# Patient Record
Sex: Female | Born: 1945 | Race: White | Hispanic: No | Marital: Married | Smoking: Never smoker
Health system: Southern US, Community
[De-identification: ages and names within clinical notes are randomized; demographics above are authoritative.]

## PROBLEM LIST (undated history)

## (undated) DIAGNOSIS — E785 Hyperlipidemia, unspecified: Secondary | ICD-10-CM

## (undated) DIAGNOSIS — R0602 Shortness of breath: Secondary | ICD-10-CM

## (undated) DIAGNOSIS — M341 CR(E)ST syndrome: Secondary | ICD-10-CM

## (undated) DIAGNOSIS — Z789 Other specified health status: Secondary | ICD-10-CM

## (undated) DIAGNOSIS — I272 Pulmonary hypertension, unspecified: Secondary | ICD-10-CM

## (undated) DIAGNOSIS — M1990 Unspecified osteoarthritis, unspecified site: Secondary | ICD-10-CM

## (undated) DIAGNOSIS — R002 Palpitations: Secondary | ICD-10-CM

## (undated) DIAGNOSIS — I10 Essential (primary) hypertension: Secondary | ICD-10-CM

## (undated) DIAGNOSIS — K219 Gastro-esophageal reflux disease without esophagitis: Secondary | ICD-10-CM

## (undated) DIAGNOSIS — E041 Nontoxic single thyroid nodule: Secondary | ICD-10-CM

## (undated) DIAGNOSIS — I509 Heart failure, unspecified: Secondary | ICD-10-CM

## (undated) HISTORY — DX: Cr(e)st syndrome: M34.1

## (undated) HISTORY — DX: Shortness of breath: R06.02

## (undated) HISTORY — DX: Unspecified osteoarthritis, unspecified site: M19.90

## (undated) HISTORY — DX: Palpitations: R00.2

## (undated) HISTORY — DX: Nontoxic single thyroid nodule: E04.1

## (undated) HISTORY — DX: Essential (primary) hypertension: I10

## (undated) HISTORY — DX: Other specified health status: Z78.9

## (undated) HISTORY — PX: LAPAROSCOPY: SHX197

## (undated) HISTORY — DX: Hyperlipidemia, unspecified: E78.5

## (undated) HISTORY — DX: Pulmonary hypertension, unspecified: I27.20

## (undated) HISTORY — DX: Gastro-esophageal reflux disease without esophagitis: K21.9

---

## 1982-06-05 HISTORY — PX: OTHER SURGICAL HISTORY: SHX169

## 2011-01-25 ENCOUNTER — Telehealth: Payer: Self-pay | Admitting: Cardiovascular Disease

## 2011-01-25 NOTE — Telephone Encounter (Signed)
Consult referral from [facility] spoke w/ Melina Schools, referring pt for symptoms of SOB, palpitations, hx of exertion, hx of hypertension, faxing over records, please call pt back to get in sooner for consult with Dr. Elease Hashimoto @ [phone].

## 2011-02-08 ENCOUNTER — Encounter: Payer: Self-pay | Admitting: Cardiovascular Disease

## 2011-02-08 ENCOUNTER — Ambulatory Visit (INDEPENDENT_AMBULATORY_CARE_PROVIDER_SITE_OTHER): Payer: BC Managed Care – PPO | Admitting: Cardiovascular Disease

## 2011-02-08 DIAGNOSIS — R0600 Dyspnea, unspecified: Secondary | ICD-10-CM

## 2011-02-08 DIAGNOSIS — E785 Hyperlipidemia, unspecified: Secondary | ICD-10-CM

## 2011-02-08 DIAGNOSIS — R0609 Other forms of dyspnea: Secondary | ICD-10-CM

## 2011-02-08 NOTE — Assessment & Plan Note (Signed)
I suspect that most of her shortness of breath is just do to deconditioning. Recently can do an echocardiogram for further evaluation of her dyspnea and her valvular function although her cardiac exam is normal here today. I doubt that she has significant valvular disease or congestive heart failure.  He does not describe any symptoms of chest pain or chest tightness and so don't think that we need to evaluate her for ischemic heart disease at this point.  She would like to try walking on irregular basis to see if she is able to start a good exercise regimen. If she does well with that, then I think it we will not have to do anything further. On the other hand, we'll be very quick to refer her on for echocardiogram or an nuclear stress testing if she has problems with her exercise regimen to

## 2011-02-08 NOTE — Progress Notes (Addendum)
Olivia Washington Date of Birth  Apr 06, 1946 [facility] [address]     [address] [place], [place]  [address] [phone]  Fax  [phone]  History of Present Illness:  65 year old female with a history of shortness of breath and palpitations. She has a history of hyperlipidemia. She does not tolerate any statin medications. She has a history of CREST syndrome.  Several weeks ago she had several episodes of shortness of breath. One episode occurred after she picked up a laundry basket. Another episode occurred while she was at school walking up an incline. Both of these were associated with some slight palpitations.  She had an x-ray and was told that she had COPD. She was referred here for further evaluation.  She works at the [employer] and at school. She's been getting a little bit more exercise for the past several weeks and she's feeling better. She's not having quite as much shortness of breath.  He denies any chest pain or chest heaviness throughout all this.  Current Outpatient Prescriptions  Medication Sig Dispense Refill  . amitriptyline (ELAVIL) 10 MG tablet Take 10 mg by mouth at bedtime.        Marland Kitchen aspirin 81 MG tablet Take 81 mg by mouth daily.        . bisoprolol-hydrochlorothiazide (ZIAC) 5-6.25 MG per tablet Take 1 tablet by mouth daily.        . lansoprazole (PREVACID) 30 MG capsule Take 30 mg by mouth 2 (two) times daily.        Marland Kitchen loratadine (CLARITIN) 10 MG tablet Take 10 mg by mouth daily.        . meclizine (ANTIVERT) 25 MG tablet Take 25 mg by mouth 3 (three) times daily as needed.        . naproxen (NAPROSYN) 500 MG tablet Take 500 mg by mouth as needed.       . RED YEAST RICE EXTRACT PO Take by mouth 4 (four) times daily.        . SUMAtriptan (IMITREX) 50 MG tablet Take 50 mg by mouth every 2 (two) hours as needed.        . vitamin E (VITAMIN E) 400 UNIT capsule Take 400 Units by mouth daily.           Allergies    Allergen Reactions  . Cortisporin   . Keflex   . Macrodantin   . Neomycin   . Parafon Forte Dsc   . Penicillins   . Septra (Bactrim)   . Sulfa Drugs Cross Reactors   . Zithromax (Azithromycin Dihydrate)     Past Medical History  Diagnosis Date  . Hypertension   . Migraine   . Hyperlipidemia   . GERD (gastroesophageal reflux disease)   . Thyroid nodule   . CREST (calcinosis, Raynaud's phenomenon, esophageal dysfunction, sclerodactyly, telangiectasia)   . CREST syndrome   . Allergy history unknown   . SOB (shortness of breath)   . Heart palpitations   . Osteoarthrosis     unspecified whether generalized or localized    Past Surgical History  Procedure Date  . Laparoscopy     History  Smoking status  . Never Smoker   Smokeless tobacco  . Not on file    History  Alcohol Use No    Family History  Problem Relation Age of Onset  . Stroke Mother   . Hypertension Mother   . Hyperlipidemia Mother   . Diabetes Father   .  Heart failure Father   . Hyperlipidemia Brother   . Hypertension Brother     Reviw of Systems:  Reviewed in the HPI.  All other systems are negative.  Physical Exam: BP 142/88  Pulse 62  Ht 5\' 5"  (1.651 m)  Wt 187 lb 12.8 oz (85.186 kg)  BMI 31.25 kg/m2 The patient is alert and oriented x 3.  The mood and affect are normal.   Skin: warm and dry.  Color is normal.    HEENT:   the sclera are nonicteric.  The mucous membranes are moist.  The carotids are 2+ without bruits.  There is no thyromegaly.  There is no JVD.    Lungs: clear.  The chest wall is non tender.    Heart: regular rate with a normal S1 and S2.  There are no murmurs, gallops, or rubs. The PMI is not displaced.     Abdomen: good bowel sounds.  There is no guarding or rebound.  There is no hepatosplenomegaly or tenderness.  There are no masses.   Extremities:  no clubbing, cyanosis, or edema.  The legs are without rashes.  The distal pulses are intact.   Neuro:  Cranial  nerves II - XII are intact.  Motor and sensory functions are intact.    The gait is normal.  ECG: Normal sinus rhythm. She is no ST or T wave changes.  Assessment / Plan:

## 2011-02-09 ENCOUNTER — Encounter: Payer: Self-pay | Admitting: Cardiovascular Disease

## 2011-05-16 ENCOUNTER — Encounter: Payer: Self-pay | Admitting: Cardiovascular Disease

## 2011-05-16 ENCOUNTER — Ambulatory Visit (INDEPENDENT_AMBULATORY_CARE_PROVIDER_SITE_OTHER): Payer: BC Managed Care – PPO | Admitting: Cardiovascular Disease

## 2011-05-16 VITALS — BP 118/72 | HR 64 | Ht 65.0 in | Wt 186.0 lb

## 2011-05-16 DIAGNOSIS — R0609 Other forms of dyspnea: Secondary | ICD-10-CM

## 2011-05-16 DIAGNOSIS — R0600 Dyspnea, unspecified: Secondary | ICD-10-CM

## 2011-05-16 NOTE — Assessment & Plan Note (Signed)
Her dyspnea is well controlled. At this point I do not think that a need to follow her on a regular basis but I would be happy to see her in the future if needed. She is to continue with a regular exercise program.

## 2011-05-16 NOTE — Progress Notes (Signed)
Olivia Washington Date of Birth  1945/06/17 [facility] [address]    [address] [place], [place]  [address] [phone]  Fax  [phone]  History of Present Illness:  Ounces a 65 year old female who I saw several months ago for some shortness breath. She's been on an exercise program. Her dyspnea with exertion has gradually improved. She's not having episodes of chest pain or  worsening shortness of breath.  Current Outpatient Prescriptions on File Prior to Visit  Medication Sig Dispense Refill  . amitriptyline (ELAVIL) 10 MG tablet Take 10 mg by mouth at bedtime.        Marland Kitchen aspirin 81 MG tablet Take 81 mg by mouth daily.        . bisoprolol-hydrochlorothiazide (ZIAC) 5-6.25 MG per tablet Take 1 tablet by mouth daily.        . lansoprazole (PREVACID) 30 MG capsule Take 30 mg by mouth 2 (two) times daily.        Marland Kitchen loratadine (CLARITIN) 10 MG tablet Take 10 mg by mouth daily.        . meclizine (ANTIVERT) 25 MG tablet Take 25 mg by mouth 3 (three) times daily as needed.        . naproxen (NAPROSYN) 500 MG tablet Take 500 mg by mouth as needed.       . RED YEAST RICE EXTRACT PO Take by mouth 4 (four) times daily.        . SUMAtriptan (IMITREX) 50 MG tablet Take 50 mg by mouth every 2 (two) hours as needed.        . vitamin E (VITAMIN E) 400 UNIT capsule Take 400 Units by mouth daily.          Allergies  Allergen Reactions  . Atorvastatin Other (See Comments)    Muscle aches  . Cortisporin   . Keflex   . Macrodantin   . Neomycin   . Parafon Forte Dsc   . Penicillins   . Pravastatin Other (See Comments)    Muscle aches   . Septra (Bactrim)   . Simvastatin Other (See Comments)    Muscle aches   . Sulfa Drugs Cross Reactors   . Zithromax (Azithromycin Dihydrate)     Past Medical History  Diagnosis Date  . Hypertension   . Migraine   . Hyperlipidemia   . GERD (gastroesophageal reflux disease)   . Thyroid nodule   . CREST (calcinosis, Raynaud's phenomenon,  esophageal dysfunction, sclerodactyly, telangiectasia)   . CREST syndrome   . Allergy history unknown   . SOB (shortness of breath)   . Heart palpitations   . Osteoarthrosis     unspecified whether generalized or localized    Past Surgical History  Procedure Date  . Laparoscopy     History  Smoking status  . Never Smoker   Smokeless tobacco  . Not on file    History  Alcohol Use No    Family History  Problem Relation Age of Onset  . Stroke Mother   . Hypertension Mother   . Hyperlipidemia Mother   . Diabetes Father   . Heart failure Father   . Hyperlipidemia Brother   . Hypertension Brother     Reviw of Systems:  Reviewed in the HPI.  All other systems are negative.  Physical Exam: BP 118/72  Pulse 64  Ht 5\' 5"  (1.651 m)  Wt 186 lb (84.369 kg)  BMI 30.95 kg/m2 The patient is alert and oriented x 3.  The mood and affect are normal.   Skin: warm and dry.  Color is normal.    HEENT:   Normocephalic/atraumatic. Her carotids are normal. There is no JVD. Her mucous membranes are moist. Neck is supple.  Lungs: Lungs are clear.   Heart: Regular rate, S1-S2.    Abdomen: Abdominal tenderness good bowel sounds and is nontender. There is no hepatosplenomegaly.  Extremities:  No clubbing cyanosis or edema  Neuro:  Exam is nonfocal  ECG:  Assessment / Plan:

## 2011-05-16 NOTE — Patient Instructions (Signed)
Your physician recommends that you schedule a follow-up appointment in: AS NEEDED BASIS  

## 2012-05-07 ENCOUNTER — Encounter: Payer: Self-pay | Admitting: Nurse Practitioner

## 2012-05-07 ENCOUNTER — Ambulatory Visit (INDEPENDENT_AMBULATORY_CARE_PROVIDER_SITE_OTHER): Payer: BC Managed Care – PPO | Admitting: Nurse Practitioner

## 2012-05-07 VITALS — BP 140/88 | HR 68 | Ht 65.0 in | Wt 182.1 lb

## 2012-05-07 DIAGNOSIS — M349 Systemic sclerosis, unspecified: Secondary | ICD-10-CM

## 2012-05-07 DIAGNOSIS — R0600 Dyspnea, unspecified: Secondary | ICD-10-CM

## 2012-05-07 DIAGNOSIS — R0609 Other forms of dyspnea: Secondary | ICD-10-CM

## 2012-05-07 DIAGNOSIS — M341 CR(E)ST syndrome: Secondary | ICD-10-CM

## 2012-05-07 DIAGNOSIS — R9431 Abnormal electrocardiogram [ECG] [EKG]: Secondary | ICD-10-CM

## 2012-05-07 LAB — CBC WITH DIFFERENTIAL/PLATELET
Basophils Absolute: 0.1 10*3/uL (ref 0.0–0.1)
Basophils Relative: 0.7 % (ref 0.0–3.0)
Eosinophils Absolute: 0.1 10*3/uL (ref 0.0–0.7)
Eosinophils Relative: 0.8 % (ref 0.0–5.0)
HCT: 43.1 % (ref 36.0–46.0)
Hemoglobin: 13.8 g/dL (ref 12.0–15.0)
Lymphocytes Relative: 25.3 % (ref 12.0–46.0)
Lymphs Abs: 1.8 10*3/uL (ref 0.7–4.0)
MCHC: 32.1 g/dL (ref 30.0–36.0)
MCV: 81.4 fl (ref 78.0–100.0)
Monocytes Absolute: 0.8 10*3/uL (ref 0.1–1.0)
Monocytes Relative: 11.4 % (ref 3.0–12.0)
Neutro Abs: 4.5 10*3/uL (ref 1.4–7.7)
Neutrophils Relative %: 61.8 % (ref 43.0–77.0)
Platelets: 242 10*3/uL (ref 150.0–400.0)
RBC: 5.3 Mil/uL — ABNORMAL HIGH (ref 3.87–5.11)
RDW: 14.4 % (ref 11.5–14.6)
WBC: 7.3 10*3/uL (ref 4.5–10.5)

## 2012-05-07 LAB — BASIC METABOLIC PANEL
BUN: 14 mg/dL (ref 6–23)
CO2: 28 mEq/L (ref 19–32)
Calcium: 9.7 mg/dL (ref 8.4–10.5)
Chloride: 100 mEq/L (ref 96–112)
Creatinine, Ser: 1 mg/dL (ref 0.4–1.2)
GFR: 62.89 mL/min (ref >60.00)
Glucose, Bld: 90 mg/dL (ref 70–99)
Potassium: 3.8 mEq/L (ref 3.5–5.1)
Sodium: 136 mEq/L (ref 135–145)

## 2012-05-07 LAB — BRAIN NATRIURETIC PEPTIDE: Pro B Natriuretic peptide (BNP): 277 pg/mL — ABNORMAL HIGH (ref 0.0–100.0)

## 2012-05-07 NOTE — Progress Notes (Signed)
Olivia Washington Date of Birth: July 23, 1945 Medical Record #[id number]  History of Present Illness: Olivia Washington is seen back today for a work in visit. She is seen for Dr. Elease Hashimoto. She has HTN, CREST, HLD and OA. She has had chronic issues with shortness of breath.   She was last here about a year ago. Was felt to be doing well. Her dyspnea had improved with exercise.   She comes in today. She is here alone. She is here because of a spell that she had last Saturday. She was carrying the laundry basket. Got short of breath and felt lightheaded. No pain at that time. Went to lie down and then felt like "an elephant" was on her chest. She immediately sat up and it went away. It has not recurred. She does feel like she has gotten more short of breath over the past few months that is worse with exertion. She is not exercising but stays active with her job. Does not know how her blood pressure has been. No cuff at home. Does probably get too much salt. Saw her primary care about 6 months ago with an EKG. She says she was told the machine was malfunctioning. No recent labs reported.   Current Outpatient Prescriptions on File Prior to Visit  Medication Sig Dispense Refill  . amitriptyline (ELAVIL) 10 MG tablet Take 10 mg by mouth at bedtime.        Marland Kitchen aspirin 81 MG tablet Take 81 mg by mouth daily.        . bisoprolol-hydrochlorothiazide (ZIAC) 5-6.25 MG per tablet Take 1 tablet by mouth daily.        . Calcium-Vitamin D-Vitamin K 500-500-40 MG-UNT-MCG CHEW Chew by mouth 2 (two) times daily.      . cyclobenzaprine (FLEXERIL) 10 MG tablet Take 10 mg by mouth as needed.       Marland Kitchen HYDROcodone-acetaminophen (VICODIN) 5-500 MG per tablet Take 1 tablet by mouth as needed. migraines      . lansoprazole (PREVACID) 30 MG capsule Take 30 mg by mouth 2 (two) times daily.        Marland Kitchen loratadine (CLARITIN) 10 MG tablet Take 10 mg by mouth daily.        . meclizine (ANTIVERT) 25 MG tablet Take 25 mg by mouth 3 (three) times  daily as needed.        . RED YEAST RICE EXTRACT PO Take 600 mg by mouth 2 (two) times daily.       . SUMAtriptan (IMITREX) 50 MG tablet Take 50 mg by mouth as needed.       . vitamin E (VITAMIN E) 400 UNIT capsule Take 400 Units by mouth daily.        Marland Kitchen ibuprofen (ADVIL,MOTRIN) 800 MG tablet Take 800 mg by mouth as needed.       . naproxen (NAPROSYN) 500 MG tablet Take 500 mg by mouth as needed.         Allergies  Allergen Reactions  . Atorvastatin Other (See Comments)    Muscle aches  . Cephalexin   . Chlorzoxazone   . Macrodantin   . Neomycin   . Neomycin-Polymyxin-Hc   . Penicillins   . Pravastatin Other (See Comments)    Muscle aches   . Septra (Bactrim)   . Simvastatin Other (See Comments)    Muscle aches   . Sulfa Drugs Cross Reactors   . Zithromax (Azithromycin Dihydrate)     Past Medical History  Diagnosis Date  .  Hypertension   . Migraine   . Hyperlipidemia   . GERD (gastroesophageal reflux disease)   . Thyroid nodule   . CREST (calcinosis, Raynaud's phenomenon, esophageal dysfunction, sclerodactyly, telangiectasia)   . CREST syndrome   . Allergy history unknown   . SOB (shortness of breath)   . Heart palpitations   . Osteoarthrosis     unspecified whether generalized or localized    Past Surgical History  Procedure Date  . Laparoscopy     History  Smoking status  . Never Smoker   Smokeless tobacco  . Not on file    History  Alcohol Use No    Family History  Problem Relation Age of Onset  . Stroke Mother   . Hypertension Mother   . Hyperlipidemia Mother   . Diabetes Father   . Heart failure Father   . Hyperlipidemia Brother   . Hypertension Brother     Review of Systems: The review of systems is per the HPI.  All other systems were reviewed and are negative.  Physical Exam: BP 140/88  Pulse 68  Ht 5\' 5"  (1.651 m)  Wt 182 lb 1.9 oz (82.609 kg)  BMI 30.31 kg/m2 Repeat BP by me is 150/90. Patient is very pleasant and in no acute  distress. Skin is warm and dry. Color is normal.  HEENT is unremarkable. Normocephalic/atraumatic. PERRL. Sclera are nonicteric. Neck is supple. No masses. No JVD. Lungs are clear. Cardiac exam shows a regular rate and rhythm. Abdomen is soft. Extremities are without edema. Gait and ROM are intact. No gross neurologic deficits noted.  LABORATORY DATA: Pending.  No results found for this basename: WBC, HGB, HCT, PLT, GLUCOSE, CHOL, TRIG, HDL, LDLDIRECT, LDLCALC, ALT, AST, NA, K, CL, CREATININE, BUN, CO2, TSH, PSA, INR, GLUF, HGBA1C, MICROALBUR    Assessment / Plan:  1. Dyspnea - will need to check an echo and also check labs today.   2. One episode of chest discomfort with lying down - improved with immediately sitting up. No recurrence. We checked an EKG here today. She has diffuse ST and T wave changes that are new compared to her last tracing from 2012. This was reviewed with Dr. Elease Hashimoto today. We will go ahead and arrange for Lexiscan. She does not feel like she could walk sufficiently on the treadmill.   3. HLD - not on statin due to past intolerance. Takes red yeast rice.   4. HTN - BP probably not at goal. She is willing to start checking at home and keep a diary.   I will see her back in about 2 weeks after her studies are complete. Further disposition to follow.   Patient is agreeable to this plan and will call if any problems develop in the interim.

## 2012-05-07 NOTE — Patient Instructions (Addendum)
Stay on your current medicines for now  We will check labs today  We are going to arrange for an ultrasound of your heart  Get a blood pressure cuff and monitor your blood pressure at home. Keep a diary and bring in to your next visit  Walking every day is encouraged  Try to cut back your salt use  I will see you back on a day that Dr. Elease Hashimoto is here in about 2 weeks.   Call the [facility] office at [phone] if you have any questions, problems or concerns.

## 2012-05-09 ENCOUNTER — Ambulatory Visit (HOSPITAL_COMMUNITY): Payer: BC Managed Care – PPO | Attending: Cardiology | Admitting: Radiology

## 2012-05-09 VITALS — BP 143/84 | HR 66 | Ht 65.0 in | Wt 182.0 lb

## 2012-05-09 DIAGNOSIS — R0989 Other specified symptoms and signs involving the circulatory and respiratory systems: Secondary | ICD-10-CM | POA: Insufficient documentation

## 2012-05-09 DIAGNOSIS — R079 Chest pain, unspecified: Secondary | ICD-10-CM

## 2012-05-09 DIAGNOSIS — R0600 Dyspnea, unspecified: Secondary | ICD-10-CM

## 2012-05-09 DIAGNOSIS — R9431 Abnormal electrocardiogram [ECG] [EKG]: Secondary | ICD-10-CM | POA: Insufficient documentation

## 2012-05-09 DIAGNOSIS — E785 Hyperlipidemia, unspecified: Secondary | ICD-10-CM | POA: Insufficient documentation

## 2012-05-09 DIAGNOSIS — M341 CR(E)ST syndrome: Secondary | ICD-10-CM

## 2012-05-09 DIAGNOSIS — R5381 Other malaise: Secondary | ICD-10-CM | POA: Insufficient documentation

## 2012-05-09 DIAGNOSIS — R0609 Other forms of dyspnea: Secondary | ICD-10-CM | POA: Insufficient documentation

## 2012-05-09 DIAGNOSIS — R42 Dizziness and giddiness: Secondary | ICD-10-CM | POA: Insufficient documentation

## 2012-05-09 DIAGNOSIS — I10 Essential (primary) hypertension: Secondary | ICD-10-CM | POA: Insufficient documentation

## 2012-05-09 DIAGNOSIS — R000 Tachycardia, unspecified: Secondary | ICD-10-CM | POA: Insufficient documentation

## 2012-05-09 DIAGNOSIS — R55 Syncope and collapse: Secondary | ICD-10-CM | POA: Insufficient documentation

## 2012-05-09 DIAGNOSIS — R0602 Shortness of breath: Secondary | ICD-10-CM | POA: Insufficient documentation

## 2012-05-09 DIAGNOSIS — R0789 Other chest pain: Secondary | ICD-10-CM | POA: Insufficient documentation

## 2012-05-09 MED ORDER — TECHNETIUM TC 99M SESTAMIBI GENERIC - CARDIOLITE
11.0000 | Freq: Once | INTRAVENOUS | Status: AC | PRN
  Administered 2012-05-09: 11 via INTRAVENOUS

## 2012-05-09 MED ORDER — TECHNETIUM TC 99M SESTAMIBI GENERIC - CARDIOLITE
33.0000 | Freq: Once | INTRAVENOUS | Status: AC | PRN
  Administered 2012-05-09: 33 via INTRAVENOUS

## 2012-05-09 MED ORDER — REGADENOSON 0.4 MG/5ML IV SOLN
0.4000 mg | Freq: Once | INTRAVENOUS | Status: AC
  Administered 2012-05-09: 0.4 mg via INTRAVENOUS

## 2012-05-09 NOTE — Progress Notes (Signed)
[facility] SITE 3 NUCLEAR MED [id number] [place] [address] [phone]  Cardiology Nuclear Med Study  Olivia Washington is a 66 y.o. female     MRN : [id number]     DOB: [birth year]  Procedure Date: 05/09/2012  Nuclear Med Background Indication for Stress Test:  Evaluation for Ischemia and Abnormal EKG History:  No previous documented CAD Cardiac Risk Factors: Hypertension and Lipids  Symptoms:  Chest Pressure with and without Exertion (last episode of chest discomfort was about 2-weeks ago), Dizziness, DOE/SOB, Fatigue with Exertion, Rapid Heart Rate, Dizziness/Light-Headedness and h/o Syncope with last episode being 2-3 months ago.   Nuclear Pre-Procedure Caffeine/Decaff Intake:  None NPO After: 9:00pm   Lungs:  Clear. O2 Sat: 95% on room air. IV 0.9% NS with Angio Cath:  22g  IV Site: R Antecubital  IV Started by:  Bonnita Levan, RN  Chest Size (in):  42 Cup Size: B  Height: 5\' 5"  (1.651 m)  Weight:  182 lb (82.555 kg)  BMI:  Body mass index is 30.29 kg/(m^2). Tech Comments:  N/A    Nuclear Med Study 1 or 2 day study: 1 day  Stress Test Type:  Treadmill/Lexiscan  Reading MD: Olga Millers, MD  Order Authorizing Provider:  Kristeen Miss, MD  Resting Radionuclide: Technetium 85m Sestamibi  Resting Radionuclide Dose: 11.0 mCi   Stress Radionuclide:  Technetium 58m Sestamibi  Stress Radionuclide Dose: 33.0 mCi           Stress Protocol Rest HR: 66 Stress HR: 99  Rest BP: 143/84 Stress BP: 142/72  Exercise Time (min): 2:00 METS: n/a   Predicted Max HR: 156 bpm % Max HR: 63.46 bpm Rate Pressure Product: 27253   Dose of Adenosine (mg):  n/a Dose of Lexiscan: 0.4 mg  Dose of Atropine (mg): n/a Dose of Dobutamine: n/a mcg/kg/min (at max HR)  Stress Test Technologist: Smiley Houseman, CMA-N  Nuclear Technologist:  Domenic Polite, CNMT     Rest Procedure:  Myocardial perfusion imaging was performed at rest 45 minutes following the  intravenous administration of Technetium 57m Sestamibi.  Rest ECG: NSR, CRO prior inferior MI, anterior TWI.  Stress Procedure:  The patient received IV Lexiscan 0.4 mg over 15-seconds with concurrent low level exercise and then Technetium 52m Sestamibi was injected at 30-seconds while the patient continued walking one more minute.  Quantitative spect images were obtained after a 45-minute delay.  Stress ECG: No significant change from baseline ECG  QPS Raw Data Images:  Acquisition technically good; normal left ventricular size. Stress Images:  There is decreased uptake in the anterior wall. Rest Images:  There is decreased uptake in the anterior wall. Subtraction (SDS):  No evidence of ischemia. Transient Ischemic Dilatation (Normal <1.22):  1.08 Lung/Heart Ratio (Normal <0.45):  0.42  Quantitative Gated Spect Images QGS EDV:  42 ml QGS ESV:  9 ml  Impression Exercise Capacity:  Lexiscan with no exercise. BP Response:  Normal blood pressure response. Clinical Symptoms:  There is dyspnea. ECG Impression:  No significant ST segment change suggestive of ischemia. Comparison with Prior Nuclear Study: No previous nuclear study performed  Overall Impression:  Low risk stress nuclear study with a small, mild, fixed distal anterior defect consistent with soft tissue attenuation; no ischemia. There appears to be RV dysfunction on gated images; suggest echo if clinically indicated; CRO right breast lump on raw images; suggest clinical correlation and mammogram.  LV Ejection Fraction: 68%.  LV Wall Motion:  NL  LV Function; NL Wall Motion  Olga Millers

## 2012-05-13 ENCOUNTER — Ambulatory Visit (HOSPITAL_COMMUNITY): Payer: BC Managed Care – PPO | Attending: Internal Medicine | Admitting: Radiology

## 2012-05-13 DIAGNOSIS — I319 Disease of pericardium, unspecified: Secondary | ICD-10-CM | POA: Insufficient documentation

## 2012-05-13 DIAGNOSIS — R0609 Other forms of dyspnea: Secondary | ICD-10-CM | POA: Insufficient documentation

## 2012-05-13 DIAGNOSIS — I369 Nonrheumatic tricuspid valve disorder, unspecified: Secondary | ICD-10-CM | POA: Insufficient documentation

## 2012-05-13 DIAGNOSIS — R0989 Other specified symptoms and signs involving the circulatory and respiratory systems: Secondary | ICD-10-CM | POA: Insufficient documentation

## 2012-05-13 DIAGNOSIS — R0600 Dyspnea, unspecified: Secondary | ICD-10-CM

## 2012-05-13 DIAGNOSIS — M341 CR(E)ST syndrome: Secondary | ICD-10-CM

## 2012-05-13 DIAGNOSIS — R002 Palpitations: Secondary | ICD-10-CM | POA: Insufficient documentation

## 2012-05-13 DIAGNOSIS — I059 Rheumatic mitral valve disease, unspecified: Secondary | ICD-10-CM | POA: Insufficient documentation

## 2012-05-13 NOTE — Progress Notes (Signed)
Echocardiogram performed.  

## 2012-05-22 ENCOUNTER — Encounter: Payer: Self-pay | Admitting: Nurse Practitioner

## 2012-05-22 ENCOUNTER — Ambulatory Visit (INDEPENDENT_AMBULATORY_CARE_PROVIDER_SITE_OTHER): Payer: BC Managed Care – PPO | Admitting: Nurse Practitioner

## 2012-05-22 ENCOUNTER — Ambulatory Visit
Admission: RE | Admit: 2012-05-22 | Discharge: 2012-05-22 | Disposition: A | Discharge status: Home / Self Care | Payer: BC Managed Care – PPO | Source: Ambulatory Visit | Attending: Nurse Practitioner | Admitting: Nurse Practitioner

## 2012-05-22 ENCOUNTER — Other Ambulatory Visit: Payer: Self-pay | Admitting: Nurse Practitioner

## 2012-05-22 VITALS — BP 130/84 | HR 80 | Ht 65.0 in | Wt 185.8 lb

## 2012-05-22 DIAGNOSIS — Z01810 Encounter for preprocedural cardiovascular examination: Secondary | ICD-10-CM

## 2012-05-22 DIAGNOSIS — R0602 Shortness of breath: Secondary | ICD-10-CM

## 2012-05-22 DIAGNOSIS — I272 Pulmonary hypertension, unspecified: Secondary | ICD-10-CM | POA: Insufficient documentation

## 2012-05-22 DIAGNOSIS — I2789 Other specified pulmonary heart diseases: Secondary | ICD-10-CM

## 2012-05-22 LAB — APTT: aPTT: 27.7 s (ref 21.7–28.8)

## 2012-05-22 LAB — BASIC METABOLIC PANEL
BUN: 21 mg/dL (ref 6–23)
CO2: 27 mEq/L (ref 19–32)
Calcium: 9.3 mg/dL (ref 8.4–10.5)
Chloride: 103 mEq/L (ref 96–112)
Creatinine, Ser: 0.9 mg/dL (ref 0.4–1.2)
GFR: 64.44 mL/min (ref >60.00)
Glucose, Bld: 95 mg/dL (ref 70–99)
Potassium: 4 mEq/L (ref 3.5–5.1)
Sodium: 137 mEq/L (ref 135–145)

## 2012-05-22 LAB — CBC WITH DIFFERENTIAL/PLATELET
Basophils Absolute: 0 10*3/uL (ref 0.0–0.1)
Basophils Relative: 0.8 % (ref 0.0–3.0)
Eosinophils Absolute: 0.1 10*3/uL (ref 0.0–0.7)
Eosinophils Relative: 0.9 % (ref 0.0–5.0)
HCT: 39.9 % (ref 36.0–46.0)
Hemoglobin: 12.9 g/dL (ref 12.0–15.0)
Lymphocytes Relative: 24.7 % (ref 12.0–46.0)
Lymphs Abs: 1.5 10*3/uL (ref 0.7–4.0)
MCHC: 32.3 g/dL (ref 30.0–36.0)
MCV: 79.6 fl (ref 78.0–100.0)
Monocytes Absolute: 0.6 10*3/uL (ref 0.1–1.0)
Monocytes Relative: 9.8 % (ref 3.0–12.0)
Neutro Abs: 4 10*3/uL (ref 1.4–7.7)
Neutrophils Relative %: 63.8 % (ref 43.0–77.0)
Platelets: 254 10*3/uL (ref 150.0–400.0)
RBC: 5.01 Mil/uL (ref 3.87–5.11)
RDW: 15.1 % — ABNORMAL HIGH (ref 11.5–14.6)
WBC: 6.2 10*3/uL (ref 4.5–10.5)

## 2012-05-22 LAB — PROTIME-INR
INR: 0.9 ratio (ref 0.8–1.0)
Prothrombin Time: 10 s — ABNORMAL LOW (ref 10.2–12.4)

## 2012-05-22 NOTE — Patient Instructions (Addendum)
We need to check labs today. When you leave here, go to [facility] and get a chest Xray  We will refer you to Dr. Delton Coombes (pulmonary) to see after the first of the New Year  We are going to refer you to Dr. Azzie Roup (rheumatology) for evaluation  See Dr. Elease Hashimoto in one month  You are scheduled for an outpatient cardiac catheterization on Friday, December 20th with Dr. Elease Hashimoto or associates.  Go the [facility] at [facility] on 8:30AM.  Call the Heart & Vascular Center at [phone] if you are unable to make your appointment.  The code to get into the parking garage under the building is 0009. Then go to the first floor.  You must have someone available to drive you home. Someone needs to be with you for the first 24 hours after you arrive home. Please wear clothes that are easy to get on and off.  Do not eat or drink after midnight on Thursday. You may have water only with your medications on the morning of your procedure.   May sure you take your aspirin on the day of your procedure.        Directions to the Outpatient Cardiac Cath Lab at [facility]:  Please Note:  Park in [place] under the building not the parking deck.  From [employer]: Turn onto [address] Left onto [address] (1st stoplight) Right at the brick entrance to the hospital ([address] circle [address]) Bear to the right and you will see a blue sign "Heart [facility] Vascular Center" Parking garage is a sharp right, to get through the gate put in the code 0009. Once you park, take the elevator to the first floor.  Please do not arrive before 6:30 am.  The building will be dark before that time.  From [employer]: Turn onto [address] Turn left into the brick entrance to the hospital (Main circle drive) [place] to the right and you will see a blue sign "[facility]" Parking garage is a sharp right, to get through the gate put in the code 0009. Once you park,  take the elevator to the first floor.  Please do not arrive before 6:30 am.  The building will be dark before that time.

## 2012-05-22 NOTE — Progress Notes (Addendum)
Lethea Killings Date of Birth: 02-28-1946 Medical Record #[id number]  History of Present Illness: Ms. Olivia Washington is seen back today for a follow up visit. She is seen for Dr. Elease Hashimoto. She has HTN, CREST, HLD and OA. She has had chronic issues with shortness of breath.   I saw her earlier this month. She was having worsening shortness of breath, chest pain, and lightheadedness. We referred her for a stress test and echo with the results as noted below.   She comes in today. She is here with her husband. She says she is feeling better. No more chest pain. Still gets short winded at times. Seems to come and go. Some days she feels ok but then some days can't walk the length of the house. BP has been good. She brings in readings from work which are all ok. Not coughing. Not dizzy or lightheaded. Not exercising. Notes that her symptoms have been gradually coming on over the past year or so. She is not able to walk up a flight of steps without getting short of breath. She is not up to date on her mammograms.   Current Outpatient Prescriptions on File Prior to Visit  Medication Sig Dispense Refill  . amitriptyline (ELAVIL) 10 MG tablet Take 10 mg by mouth at bedtime.        Marland Kitchen aspirin 81 MG tablet Take 81 mg by mouth daily.        . bisoprolol-hydrochlorothiazide (ZIAC) 5-6.25 MG per tablet Take 1 tablet by mouth daily.        . Calcium-Vitamin D-Vitamin K 500-500-40 MG-UNT-MCG CHEW Chew by mouth 2 (two) times daily.      . cyclobenzaprine (FLEXERIL) 10 MG tablet Take 10 mg by mouth as needed.       Marland Kitchen HYDROcodone-acetaminophen (VICODIN) 5-500 MG per tablet Take 1 tablet by mouth as needed. migraines      . lansoprazole (PREVACID) 30 MG capsule Take 30 mg by mouth 2 (two) times daily.        Marland Kitchen loratadine (CLARITIN) 10 MG tablet Take 10 mg by mouth daily.        . meclizine (ANTIVERT) 25 MG tablet Take 25 mg by mouth 3 (three) times daily as needed.        . RED YEAST RICE EXTRACT PO Take 600 mg by mouth 2  (two) times daily.       . SUMAtriptan (IMITREX) 50 MG tablet Take 50 mg by mouth as needed.       . vitamin E (VITAMIN E) 400 UNIT capsule Take 400 Units by mouth daily.        Marland Kitchen ibuprofen (ADVIL,MOTRIN) 800 MG tablet Take 800 mg by mouth as needed.       . naproxen (NAPROSYN) 500 MG tablet Take 500 mg by mouth as needed.         Allergies  Allergen Reactions  . Atorvastatin Other (See Comments)    Muscle aches  . Cephalexin   . Chlorzoxazone   . Macrodantin   . Neomycin   . Neomycin-Polymyxin-Hc   . Penicillins   . Pravastatin Other (See Comments)    Muscle aches   . Septra (Bactrim)   . Simvastatin Other (See Comments)    Muscle aches   . Sulfa Drugs Cross Reactors   . Tape   . Zithromax (Azithromycin Dihydrate)     Past Medical History  Diagnosis Date  . Hypertension   . Migraine   . Hyperlipidemia   .  GERD (gastroesophageal reflux disease)   . Thyroid nodule   . CREST (calcinosis, Raynaud's phenomenon, esophageal dysfunction, sclerodactyly, telangiectasia)   . CREST syndrome   . Allergy history unknown   . SOB (shortness of breath)   . Heart palpitations   . Osteoarthrosis     unspecified whether generalized or localized    Past Surgical History  Procedure Date  . Laparoscopy     History  Smoking status  . Never Smoker   Smokeless tobacco  . Not on file    History  Alcohol Use No    Family History  Problem Relation Age of Onset  . Stroke Mother   . Hypertension Mother   . Hyperlipidemia Mother   . Diabetes Father   . Heart failure Father   . Hyperlipidemia Brother   . Hypertension Brother     Review of Systems: The review of systems is per the HPI.  All other systems were reviewed and are negative.  Physical Exam: BP 130/84  Pulse 80  Ht 5\' 5"  (1.651 m)  Wt 185 lb 12.8 oz (84.278 kg)  BMI 30.92 kg/m2 Patient is very pleasant and in no acute distress. She is obese. Skin is warm and dry. Color is normal.  HEENT is unremarkable.  Normocephalic/atraumatic. PERRL. Sclera are nonicteric. Neck is supple. No masses. No JVD. Lungs are clear. Cardiac exam shows a regular rate and rhythm. Abdomen is soft. Extremities are without edema. Gait and ROM are intact. No gross neurologic deficits noted.   LABORATORY DATA: CXR and labs are pending for today.   Lab Results  Component Value Date   WBC 7.3 05/07/2012   HGB 13.8 05/07/2012   HCT 43.1 05/07/2012   PLT 242.0 05/07/2012   GLUCOSE 90 05/07/2012   NA 136 05/07/2012   K 3.8 05/07/2012   CL 100 05/07/2012   CREATININE 1.0 05/07/2012   BUN 14 05/07/2012   CO2 28 05/07/2012   Echo Study Conclusions  - Left ventricle: The cavity size was normal. Wall thickness was increased in a pattern of mild LVH. Systolic function was normal. The estimated ejection fraction was in the range of 55% to 60%. Regional wall motion abnormalities cannot be excluded. Doppler parameters are consistent with abnormal left ventricular relaxation (grade 1 diastolic dysfunction). - Ventricular septum: The contour showed diastolic flattening and systolic flattening. - Left atrium: The atrium was mildly dilated. - Right ventricle: The cavity size was mildly dilated. Systolic function was moderately reduced. - Right atrium: The atrium was mildly dilated. - Atrial septum: The septum bowed from right to left, consistent with increased right atrial pressure. - Pulmonary arteries: Systolic pressure was moderately to severely increased. PA peak pressure: 54mm Hg (S). - Pericardium, extracardiac: A small pericardial effusion was identified.  Dr. Elease Hashimoto has personally reviewed this echo and felt that the echo was normal from a cardiac standpoint but with pulmonary HTN noted.   Myoview Overall Impression:   Low risk stress nuclear study with a small, mild, fixed distal anterior defect consistent with soft tissue attenuation; no ischemia. There appears to be RV dysfunction on gated images; suggest echo if  clinically indicated; CRO right breast lump on raw images; suggest clinical correlation and mammogram.  LV Ejection Fraction: 68%. LV Wall Motion: NL LV Function; NL Wall Motion   Olga Millers     Assessment / Plan: 1. Dyspnea - probably related to her CREST and now with pulmonary HTN noted on echo. She has not seen rheumatology in many  years. Will refer to Dr. Dareen Piano. Will arrange pulmonary referral with Dr. Delton Coombes as well after the first of the New Year. Will proceed with left and right heart cath with adenosine challenge this Friday to further define. The procedure, risks and benefits have been reviewed with her and her husband by Dr. Elease Hashimoto and she is willing to proceed. Will also need CT angio of the chest next Friday to rule out chronic PE. She may need to have her medicine changed from beta blocker to CCB therapy. Seeing Dr. Elease Hashimoto back in one month.   2. Atypical chest pain - probably related to the pulmonary HTN as well. This has improved.   3. HTN - blood pressure looks good.   Patient was also advised to get her mammogram updated as well.   Call the [facility] office at [phone] if you have any questions, problems or concerns.

## 2012-05-24 ENCOUNTER — Encounter (HOSPITAL_BASED_OUTPATIENT_CLINIC_OR_DEPARTMENT_OTHER)
Admission: RE | Disposition: A | Discharge status: Home / Self Care | Payer: Self-pay | Source: Ambulatory Visit | Attending: Cardiovascular Disease

## 2012-05-24 ENCOUNTER — Inpatient Hospital Stay (HOSPITAL_BASED_OUTPATIENT_CLINIC_OR_DEPARTMENT_OTHER)
Admission: RE | Admit: 2012-05-24 | Discharge: 2012-05-24 | Disposition: A | Discharge status: Home / Self Care | Payer: BC Managed Care – PPO | Source: Ambulatory Visit | Attending: Cardiovascular Disease | Admitting: Cardiovascular Disease

## 2012-05-24 DIAGNOSIS — R0989 Other specified symptoms and signs involving the circulatory and respiratory systems: Secondary | ICD-10-CM | POA: Insufficient documentation

## 2012-05-24 DIAGNOSIS — I2789 Other specified pulmonary heart diseases: Secondary | ICD-10-CM | POA: Insufficient documentation

## 2012-05-24 DIAGNOSIS — I279 Pulmonary heart disease, unspecified: Secondary | ICD-10-CM

## 2012-05-24 DIAGNOSIS — R079 Chest pain, unspecified: Secondary | ICD-10-CM

## 2012-05-24 DIAGNOSIS — Z01810 Encounter for preprocedural cardiovascular examination: Secondary | ICD-10-CM

## 2012-05-24 DIAGNOSIS — R0609 Other forms of dyspnea: Secondary | ICD-10-CM | POA: Insufficient documentation

## 2012-05-24 LAB — POCT I-STAT 3, ART BLOOD GAS (G3+)
Acid-base deficit: 1 mmol/L (ref 0.0–2.0)
O2 Saturation: 95 %
pCO2 arterial: 34.7 mmHg — ABNORMAL LOW (ref 35.0–45.0)

## 2012-05-24 LAB — POCT I-STAT 3, VENOUS BLOOD GAS (G3P V)
pCO2, Ven: 38.1 mmHg — ABNORMAL LOW (ref 45.0–50.0)
pH, Ven: 7.418 — ABNORMAL HIGH (ref 7.250–7.300)

## 2012-05-24 SURGERY — JV LEFT AND RIGHT HEART CATHETERIZATION WITH CORONARY ANGIOGRAM
Anesthesia: Moderate Sedation

## 2012-05-24 MED ORDER — DIAZEPAM 5 MG PO TABS
10.0000 mg | ORAL_TABLET | ORAL | Status: AC
  Administered 2012-05-24: 10 mg via ORAL

## 2012-05-24 MED ORDER — ADENOSINE (PAH - CATH LAB) SYRINGE(S)
84.0000 mL | Freq: Once | INTRAVENOUS | Status: AC
  Administered 2012-05-24: 84 mL via INTRAVENOUS
  Filled 2012-05-24 (×2): qty 84

## 2012-05-24 MED ORDER — SODIUM CHLORIDE 0.9 % IV SOLN: 250.0000 mL | INTRAVENOUS | Status: DC | PRN

## 2012-05-24 MED ORDER — SODIUM CHLORIDE 0.9 % IJ SOLN: 3.0000 mL | INTRAMUSCULAR | Status: DC | PRN

## 2012-05-24 MED ORDER — ACETAMINOPHEN 325 MG PO TABS: 650.0000 mg | ORAL_TABLET | ORAL | Status: DC | PRN

## 2012-05-24 MED ORDER — SODIUM CHLORIDE 0.9 % IV SOLN
INTRAVENOUS | Status: DC
  Administered 2012-05-24: 09:00:00 via INTRAVENOUS

## 2012-05-24 MED ORDER — SODIUM CHLORIDE 0.9 % IJ SOLN: 3.0000 mL | Freq: Two times a day (BID) | INTRAMUSCULAR | Status: DC

## 2012-05-24 MED ORDER — SODIUM CHLORIDE 0.9 % IV SOLN: INTRAVENOUS | Status: AC

## 2012-05-24 MED ORDER — ONDANSETRON HCL 4 MG/2ML IJ SOLN: 4.0000 mg | Freq: Four times a day (QID) | INTRAMUSCULAR | Status: DC | PRN

## 2012-05-24 NOTE — OR Nursing (Signed)
Tegaderm dressing applied, site level 0, bedrest begins at 1235 

## 2012-05-24 NOTE — CV Procedure (Signed)
    Cardiac Cath Note  Olivia Washington [id number] 02/11/1946  Procedure: Right and Left  Heart Cardiac Catheterization with Adenosine challange Indications: Chest pain, dyspnea, pulmonary hypertension  Procedure Details Consent: Obtained Time Out: Verified patient identification, verified procedure, site/side was marked, verified correct patient position, special equipment/implants available, Radiology Safety Procedures followed,  medications/allergies/relevent history reviewed, required imaging and test results available.  Performed   Medications: Fentanyl: 25 mcg Versed: 2 mg IV Adenosine 234 mg IV over Adenosine challenge period  The right femoral artery and right femoral vein were easily canulated using a modified Seldinger technique.  Hemodynamics:    Baseline RA: 11/8/6 RV: 86/7 PCWP: 8/8/5 PA:  78/27 (47)  Cardiac Output   Thermodilution: 3.6 with index of 1.9  Fick : 5.9 with index of 3.1  11.6 woods units   Adenosine Challenge We had significant difficulty with the adenosine pump and maintaining the proper adenosine dose.    Maximal Adenosine Response (at 100 mcg/kg/min) The lowest PA pressure was measured at the middle - low dose of adenosine challenge.  As we increased the adenosine dose, she started feeling chest pressure and became anxious.  Her PA pressures did not decrease significantly after that.  PA: 61/18 (34) We did not measure cardiac output in the middle doses of Adenosine challenge.   Assuming an average cardiac output of 4.0 .  The lowest Woods units observed would be estimated at 7.25.  At final adenosine dose 76/28 (46) CO (thermo) = 4.3 with index of 2.3  9.5 Woods units  Arterial Sat: 95% PA Sat: 72%.  LV pressure: 136/12 Aortic pressure: 137/69  Angiography   Left Main: smooth and normal   Left anterior Descending: smooth and normal  Left Circumflex: smooth and normal  Right Coronary Artery: large, dom. And normal  LV  Gram: normal LV function.  EF 65%  Complications: No apparent complications Patient did tolerate procedure well.  Contrast used: 60 cc  Conclusions:  Marked Pulmonary Hypertension.  There is a reversible component to her pulmonary hypertension. 2. Normal coronaries 3. Normal LV function.  Will change the bisoprolol to diltiazem. Will see her in the office in several weeks and make other medication adjustments.   Vesta Mixer, Montez Hageman., MD, [facility] 05/24/2012, 12:21 PM Office - [phone] Pager [phone]

## 2012-05-24 NOTE — H&P (Signed)
History of Present Illness:  Ms. Hanratty is now admitted for R and L heart cath and adenosine challenge.   She has HTN, CREST, HLD and OA. She has had chronic issues with shortness of breath.   She was having worsening shortness of breath, chest pain, and lightheadedness. We referred her for a stress test and echo with the results as noted below.   She says she is feeling better. No more chest pain. Still gets short winded at times. Seems to come and go. Some days she feels ok but then some days can't walk the length of the house. BP has been good. She brings in readings from work which are all ok. Not coughing. Not dizzy or lightheaded. Not exercising. Notes that her symptoms have been gradually coming on over the past year or so. She is not able to walk up a flight of steps without getting short of breath. She is not up to date on her mammograms.  Current Outpatient Prescriptions on File Prior to Visit   Medication  Sig  Dispense  Refill   .  amitriptyline (ELAVIL) 10 MG tablet  Take 10 mg by mouth at bedtime.     Marland Kitchen  aspirin 81 MG tablet  Take 81 mg by mouth daily.     .  bisoprolol-hydrochlorothiazide (ZIAC) 5-6.25 MG per tablet  Take 1 tablet by mouth daily.     .  Calcium-Vitamin D-Vitamin K 500-500-40 MG-UNT-MCG CHEW  Chew by mouth 2 (two) times daily.     .  cyclobenzaprine (FLEXERIL) 10 MG tablet  Take 10 mg by mouth as needed.     Marland Kitchen  HYDROcodone-acetaminophen (VICODIN) 5-500 MG per tablet  Take 1 tablet by mouth as needed. migraines     .  lansoprazole (PREVACID) 30 MG capsule  Take 30 mg by mouth 2 (two) times daily.     Marland Kitchen  loratadine (CLARITIN) 10 MG tablet  Take 10 mg by mouth daily.     .  meclizine (ANTIVERT) 25 MG tablet  Take 25 mg by mouth 3 (three) times daily as needed.     .  RED YEAST RICE EXTRACT PO  Take 600 mg by mouth 2 (two) times daily.     .  SUMAtriptan (IMITREX) 50 MG tablet  Take 50 mg by mouth as needed.     .  vitamin E (VITAMIN E) 400 UNIT capsule  Take 400 Units  by mouth daily.     Marland Kitchen  ibuprofen (ADVIL,MOTRIN) 800 MG tablet  Take 800 mg by mouth as needed.     .  naproxen (NAPROSYN) 500 MG tablet  Take 500 mg by mouth as needed.      Allergies   Allergen  Reactions   .  Atorvastatin  Other (See Comments)     Muscle aches   .  Cephalexin    .  Chlorzoxazone    .  Macrodantin    .  Neomycin    .  Neomycin-Polymyxin-Hc    .  Penicillins    .  Pravastatin  Other (See Comments)     Muscle aches   .  Septra (Bactrim)    .  Simvastatin  Other (See Comments)     Muscle aches   .  Sulfa Drugs Cross Reactors    .  Tape    .  Zithromax (Azithromycin Dihydrate)     Past Medical History   Diagnosis  Date   .  Hypertension    .  Migraine    .  Hyperlipidemia    .  GERD (gastroesophageal reflux disease)    .  Thyroid nodule    .  CREST (calcinosis, Raynaud's phenomenon, esophageal dysfunction, sclerodactyly, telangiectasia)    .  CREST syndrome    .  Allergy history unknown    .  SOB (shortness of breath)    .  Heart palpitations    .  Osteoarthrosis      unspecified whether generalized or localized    Past Surgical History   Procedure  Date   .  Laparoscopy     History   Smoking status   .  Never Smoker   Smokeless tobacco   .  Not on file    History   Alcohol Use  No    Family History   Problem  Relation  Age of Onset   .  Stroke  Mother    .  Hypertension  Mother    .  Hyperlipidemia  Mother    .  Diabetes  Father    .  Heart failure  Father    .  Hyperlipidemia  Brother    .  Hypertension  Brother     Review of Systems:  The review of systems is per the HPI. All other systems were reviewed and are negative.  Physical Exam:  BP 130/84  Pulse 80  Ht 5\' 5"  (1.651 m)  Wt 185 lb 12.8 oz (84.278 kg)  BMI 30.92 kg/m2  Patient is very pleasant and in no acute distress. She is obese. Skin is warm and dry. Color is normal. HEENT is unremarkable. Normocephalic/atraumatic. PERRL. Sclera are nonicteric. Neck is supple. No masses.  No JVD. Lungs are clear. Cardiac exam shows a regular rate and rhythm. Abdomen is soft. Extremities are without edema. Gait and ROM are intact. No gross neurologic deficits noted.  LABORATORY DATA: CXR and labs are pending for today.  Lab Results   Component  Value  Date    WBC  7.3  05/07/2012    HGB  13.8  05/07/2012    HCT  43.1  05/07/2012    PLT  242.0  05/07/2012    GLUCOSE  90  05/07/2012    NA  136  05/07/2012    K  3.8  05/07/2012    CL  100  05/07/2012    CREATININE  1.0  05/07/2012    BUN  14  05/07/2012    CO2  28  05/07/2012    Echo Study Conclusions  - Left ventricle: The cavity size was normal. Wall thickness was increased in a pattern of mild LVH. Systolic function was normal. The estimated ejection fraction was in the range of 55% to 60%. Regional wall motion abnormalities cannot be excluded. Doppler parameters are consistent with abnormal left ventricular relaxation (grade 1 diastolic dysfunction). - Ventricular septum: The contour showed diastolic flattening and systolic flattening. - Left atrium: The atrium was mildly dilated. - Right ventricle: The cavity size was mildly dilated. Systolic function was moderately reduced. - Right atrium: The atrium was mildly dilated. - Atrial septum: The septum bowed from right to left, consistent with increased right atrial pressure. - Pulmonary arteries: Systolic pressure was moderately to severely increased. PA peak pressure: 54mm Hg (S). - Pericardium, extracardiac: A small pericardial effusion was identified.   Myoview Overall Impression:  Low risk stress nuclear study with a small, mild, fixed distal anterior defect consistent with soft tissue attenuation; no ischemia. There appears  to be RV dysfunction on gated images; suggest echo if clinically indicated; CRO right breast lump on raw images; suggest clinical correlation and mammogram.  LV Ejection Fraction: 68%. LV Wall Motion: NL LV Function; NL Wall Motion  Olga Millers     Assessment / Plan:  1. Dyspnea -   This is likely due to her pulmonary hypertension.  Have planned a Right and left heart cath.  Will do an adenosine challenge to evaluate whether or not her PHTN is reversible.  3. HTN - blood pressure looks good.   Vesta Mixer, Montez Hageman., MD, [facility] 05/24/2012, 2:52 PM Office - [phone] Pager [phone]

## 2012-05-24 NOTE — OR Nursing (Signed)
Dr Nahser at bedside to discuss results and treatment plan with pt and family 

## 2012-05-26 ENCOUNTER — Encounter: Payer: Self-pay | Admitting: Nurse Practitioner

## 2012-05-27 ENCOUNTER — Telehealth: Payer: Self-pay | Admitting: *Deleted

## 2012-05-27 DIAGNOSIS — I272 Pulmonary hypertension, unspecified: Secondary | ICD-10-CM

## 2012-05-27 DIAGNOSIS — I2782 Chronic pulmonary embolism: Secondary | ICD-10-CM

## 2012-05-27 NOTE — Telephone Encounter (Signed)
Message copied by Antony Odea on Mon May 27, 2012  8:09 AM ------      Message from: Vesta Mixer      Created: Fri May 24, 2012  5:13 PM       Ms. Jeudy need            VQ scan ( in addition to the CT angio of lungs      Sleep study            She will be coming to Heart failure clinic where Dr. Gala Romney and I are going to see her together.            She has pulmonary hypertension - at least partially reversible.

## 2012-05-27 NOTE — Telephone Encounter (Signed)
msg left that she will receive a call from the office to schedule all tests// vq scan, sleep study and ct scan, confirmed tests needed  with [facility].

## 2012-05-30 ENCOUNTER — Ambulatory Visit (HOSPITAL_COMMUNITY)
Admission: RE | Admit: 2012-05-30 | Discharge: 2012-05-30 | Disposition: A | Discharge status: Home / Self Care | Payer: BC Managed Care – PPO | Source: Ambulatory Visit | Attending: Cardiovascular Disease | Admitting: Cardiovascular Disease

## 2012-05-30 ENCOUNTER — Encounter (HOSPITAL_COMMUNITY)
Admission: RE | Admit: 2012-05-30 | Discharge: 2012-05-30 | Disposition: A | Discharge status: Home / Self Care | Payer: BC Managed Care – PPO | Source: Ambulatory Visit | Attending: Cardiovascular Disease | Admitting: Cardiovascular Disease

## 2012-05-30 DIAGNOSIS — R0602 Shortness of breath: Secondary | ICD-10-CM | POA: Insufficient documentation

## 2012-05-30 DIAGNOSIS — I2789 Other specified pulmonary heart diseases: Secondary | ICD-10-CM | POA: Insufficient documentation

## 2012-05-30 DIAGNOSIS — I272 Pulmonary hypertension, unspecified: Secondary | ICD-10-CM

## 2012-05-30 DIAGNOSIS — I2699 Other pulmonary embolism without acute cor pulmonale: Secondary | ICD-10-CM | POA: Insufficient documentation

## 2012-05-30 DIAGNOSIS — I2782 Chronic pulmonary embolism: Secondary | ICD-10-CM

## 2012-05-30 MED ORDER — TECHNETIUM TO 99M ALBUMIN AGGREGATED
4.0000 | Freq: Once | INTRAVENOUS | Status: AC | PRN
  Administered 2012-05-30: 4 via INTRAVENOUS

## 2012-05-30 MED ORDER — TECHNETIUM TC 99M DIETHYLENETRIAME-PENTAACETIC ACID: 40.0000 | Freq: Once | INTRAVENOUS | Status: AC | PRN

## 2012-05-31 ENCOUNTER — Ambulatory Visit (INDEPENDENT_AMBULATORY_CARE_PROVIDER_SITE_OTHER)
Admission: RE | Admit: 2012-05-31 | Discharge: 2012-05-31 | Disposition: A | Discharge status: Home / Self Care | Payer: BC Managed Care – PPO | Source: Ambulatory Visit | Attending: Nurse Practitioner | Admitting: Nurse Practitioner

## 2012-05-31 DIAGNOSIS — I272 Pulmonary hypertension, unspecified: Secondary | ICD-10-CM

## 2012-05-31 DIAGNOSIS — I2789 Other specified pulmonary heart diseases: Secondary | ICD-10-CM

## 2012-05-31 MED ORDER — IOHEXOL 350 MG/ML SOLN
80.0000 mL | Freq: Once | INTRAVENOUS | Status: AC | PRN
  Administered 2012-05-31: 80 mL via INTRAVENOUS

## 2012-06-10 ENCOUNTER — Encounter: Payer: Self-pay | Admitting: Cardiovascular Disease

## 2012-06-12 ENCOUNTER — Ambulatory Visit (INDEPENDENT_AMBULATORY_CARE_PROVIDER_SITE_OTHER): Payer: BC Managed Care – PPO | Admitting: Emergency Medicine

## 2012-06-12 ENCOUNTER — Other Ambulatory Visit (INDEPENDENT_AMBULATORY_CARE_PROVIDER_SITE_OTHER): Payer: BC Managed Care – PPO

## 2012-06-12 ENCOUNTER — Encounter: Payer: Self-pay | Admitting: Emergency Medicine

## 2012-06-12 VITALS — BP 146/82 | HR 108 | Temp 97.5°F | Ht 65.0 in | Wt 185.2 lb

## 2012-06-12 DIAGNOSIS — I2789 Other specified pulmonary heart diseases: Secondary | ICD-10-CM

## 2012-06-12 DIAGNOSIS — R0600 Dyspnea, unspecified: Secondary | ICD-10-CM

## 2012-06-12 DIAGNOSIS — I272 Pulmonary hypertension, unspecified: Secondary | ICD-10-CM

## 2012-06-12 DIAGNOSIS — R0989 Other specified symptoms and signs involving the circulatory and respiratory systems: Secondary | ICD-10-CM

## 2012-06-12 NOTE — Patient Instructions (Addendum)
Follow with Drs Elease Hashimoto and Bensimhon as planned Get the sleep study as scheduled We will refer you to ENT to discuss thyroid biopsy and possible thyroid surgery We will check thyroid labwork today We will perform full pulmonary function testing at [facility]  Follow with Dr Delton Coombes in 1 month to review the breathing testing

## 2012-06-12 NOTE — Assessment & Plan Note (Signed)
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 Will need to get her auto-immune labs from her rheumatologist. PE has been ruled out and Dr Elease Hashimoto has ordered a PSG. She is to f/u with him and w Dr Gala Romney next week. She will probably need to be started on meds - tells me she benefited from adenosine during her cath, so a PDE-5 would be a good choice (or Ca++ blocker).

## 2012-06-12 NOTE — Progress Notes (Signed)
Subjective:    Patient ID: Olivia Washington, female    DOB: 18-Aug-1945, 67 y.o.   MRN: [id number]  HPI 67 yo woman, never smoker, followed by Dr Elease Hashimoto for HTN. 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 Has been experiencing dyspnea since November '13, progressive over about several months. Manifested as exertional dyspnea. Other sx included wheezing (could be heard by others), a single episode of chest heaviness. Still has some GERD despite sgy, on double PPI. She has undergone cardiac testing that was reassuring for ischemia, showed evidence for PAH. A CT scan chest 12/30 showed no ILD, no acute or chronic PE but some dilation of the PA's. Notably on that study there was a thyroid lesion that caused R deviation of the trachea.    Review of Systems  Constitutional: Negative for fever and unexpected weight change.  HENT: Positive for sore throat and trouble swallowing. Negative for ear pain, nosebleeds, congestion, rhinorrhea, sneezing, dental problem, postnasal drip and sinus pressure.   Eyes: Negative for redness and itching.  Respiratory: Positive for cough, shortness of breath and wheezing. Negative for chest tightness.   Cardiovascular: Positive for palpitations. Negative for leg swelling.  Gastrointestinal: Negative for nausea and vomiting.  Genitourinary: Negative for dysuria.  Musculoskeletal: Negative for joint swelling.  Skin: Negative for rash.  Neurological: Negative for headaches.  Hematological: Does not bruise/bleed easily.  Psychiatric/Behavioral: Negative for dysphoric mood. The patient is not nervous/anxious.     Past Medical History  Diagnosis Date  . Hypertension   . Migraine   . Hyperlipidemia   . GERD (gastroesophageal reflux disease)   . Thyroid nodule   . CREST (calcinosis, Raynaud's phenomenon, esophageal dysfunction, sclerodactyly, telangiectasia)   . CREST syndrome   . Allergy history unknown   . SOB  (shortness of breath)   . Heart palpitations   . Osteoarthrosis     unspecified whether generalized or localized  . Hypertension   . Pulmonary hypertension      Family History  Problem Relation Age of Onset  . Stroke Mother   . Hypertension Mother   . Hyperlipidemia Mother   . Diabetes Father   . Heart failure Father   . Hyperlipidemia Brother   . Hypertension Brother   . Cancer Father     65     History   Social History  . Marital Status: Married    Spouse Name: N/A    Number of Children: 2  . Years of Education: N/A   Occupational History  . Librarian     Social History Main Topics  . Smoking status: Never Smoker   . Smokeless tobacco: Never Used  . Alcohol Use: No  . Drug Use: No  . Sexually Active: Not Currently   Other Topics Concern  . Not on file   Social History Narrative  . No narrative on file     Allergies  Allergen Reactions  . Atorvastatin Other (See Comments)    Muscle aches  . Cephalexin   . Chlorzoxazone   . Cortisporin (Neomycin-Polymyxin-Hc)     Blister   . Macrodantin   . Neomycin   . Neomycin-Polymyxin-Hc   . Penicillins   . Pravastatin Other (See Comments)    Muscle aches   . Septra (Bactrim)   . Simvastatin Other (See Comments)    Muscle aches   . Sulfa Drugs Cross Reactors   . Tape   . Zithromax (Azithromycin Dihydrate)  Outpatient Prescriptions Prior to Visit  Medication Sig Dispense Refill  . amitriptyline (ELAVIL) 10 MG tablet Take 10 mg by mouth at bedtime.        Marland Kitchen aspirin 81 MG tablet Take 81 mg by mouth daily.        . Calcium-Vitamin D-Vitamin K 500-500-40 MG-UNT-MCG CHEW Chew by mouth 2 (two) times daily.      . cyclobenzaprine (FLEXERIL) 10 MG tablet Take 10 mg by mouth as needed.       Marland Kitchen HYDROcodone-acetaminophen (VICODIN) 5-500 MG per tablet Take 1 tablet by mouth as needed. migraines      . ibuprofen (ADVIL,MOTRIN) 800 MG tablet Take 800 mg by mouth as needed.       . lansoprazole (PREVACID) 30 MG  capsule Take 30 mg by mouth 2 (two) times daily.        Marland Kitchen loratadine (CLARITIN) 10 MG tablet Take 10 mg by mouth daily.        . meclizine (ANTIVERT) 25 MG tablet Take 25 mg by mouth 3 (three) times daily as needed.        . naproxen (NAPROSYN) 500 MG tablet Take 500 mg by mouth as needed.       . RED YEAST RICE EXTRACT PO Take 600 mg by mouth 2 (two) times daily.       . SUMAtriptan (IMITREX) 50 MG tablet Take 50 mg by mouth as needed.       . vitamin E (VITAMIN E) 400 UNIT capsule Take 400 Units by mouth daily.        . [DISCONTINUED] bisoprolol-hydrochlorothiazide (ZIAC) 5-6.25 MG per tablet Take 1 tablet by mouth daily.         Last reviewed on 06/12/2012  3:25 PM by Lazarus Salines, RN       Objective:   Physical Exam  Filed Vitals:   06/12/12 1527  BP: 146/82  Pulse: 108  Temp: 97.5 F (36.4 C)   Gen: Pleasant, well-nourished, in no distress,  normal affect  ENT: No lesions,  mouth clear,  oropharynx clear, no postnasal drip, no stridor, very hoarse voice  Neck: No JVD, no TMG, no carotid bruits, palpable L goiter  Lungs: No use of accessory muscles, no dullness to percussion, clear without rales or rhonchi  Cardiovascular: RRR, heart sounds normal, no murmur or gallops, no peripheral edema  Musculoskeletal: No deformities, no cyanosis or clubbing  Neuro: alert, non focal  Skin: Warm, no lesions or rashes   CT ANGIOGRAPHY CHEST  06/03/12 --  Technique: Multidetector CT imaging of the chest using the  standard protocol during bolus administration of intravenous  contrast. Multiplanar reconstructed images including MIPs were  obtained and reviewed to evaluate the vascular anatomy.  Contrast: 80mL OMNIPAQUE IOHEXOL 350 MG/ML SOLN  Comparison: None.  Findings: Good contrast opacification of the pulmonary artery  branches. Main pulmonary artery is dilated, 3.8 cm just proximal to  its bifurcation. Right pulmonary artery dilated at 2.8 cm diameter,  left 2.3  cm. There is no mural thickening, web, stenosis, or other  stigmata of chronic pulmonary emboli. No acute filling defects are  identified. Adequate contrast opacification of the thoracic aorta  with no evidence of dissection, aneurysm, or stenosis. There is  classic 3-vessel brachiocephalic arch anatomy. No pleural  effusion. Tiny pericardial effusion. Enlarged prevascular nodes  up to 17 mm short axis diameter. Borderline enlarged Subcarinal  and right hilar lymph nodes. There is fluid distention of the  esophagus with a  small hiatal hernia. There is asymmetric  enlargement of the left lobe of the thyroid with coarse  calcifications and low attenuation regions, resulting in rightward  tracheal deviation and some mild tracheal narrowing. Visualized  portions of upper abdomen unremarkable. Lungs are clear, some  images degraded by patient breathing during the acquisition.  Minimal spurring in the thoracic spine.  IMPRESSION:  1. Dilated central pulmonary arteries, with no evidence of acute or  chronic pulmonary emboli.  2. Nonspecific mediastinal and borderline right hilar adenopathy.  3. Left thyroid enlargement with rightward tracheal deviation.  Recommend further evaluation with thyroid ultrasound. If patient  is clinically hyperthyroid, consider nuclear medicine thyroid  uptake and scan.   TTE 05/13/12 Study Conclusions  - Left ventricle: The cavity size was normal. Wall thickness was increased in a pattern of mild LVH. Systolic function was normal. The estimated ejection fraction was in the range of 55% to 60%. Regional wall motion abnormalities cannot be excluded. Doppler parameters are consistent with abnormal left ventricular relaxation (grade 1 diastolic dysfunction). - Ventricular septum: The contour showed diastolic flattening and systolic flattening. - Left atrium: The atrium was mildly dilated. - Right ventricle: The cavity size was mildly dilated. Systolic function  was moderately reduced. - Right atrium: The atrium was mildly dilated. - Atrial septum: The septum bowed from right to left, consistent with increased right atrial pressure. - Pulmonary arteries: Systolic pressure was moderately to severely increased. PA peak pressure: 54mm Hg (S). - Pericardium, extracardiac: A small pericardial effusion was identified. Impressions:  - RA and RV enlargement; findings c/w significant pulmonary hypertension.    Stress Myoview 05/10/12 Impression  Exercise Capacity: Lexiscan with no exercise.  BP Response: Normal blood pressure response.  Clinical Symptoms: There is dyspnea.  ECG Impression: No significant ST segment change suggestive of ischemia.  Comparison with Prior Nuclear Study: No previous nuclear study performed  Overall Impression: Low risk stress nuclear study with a small, mild, fixed distal anterior defect consistent with soft tissue attenuation; no ischemia. There appears to be RV dysfunction on gated images; suggest echo if clinically indicated; CRO right breast lump on raw images; suggest clinical correlation and mammogram.  LV Ejection Fraction: 68%. 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 Will need to get her auto-immune labs from her rheumatologist. PE has been ruled out and Dr Elease Hashimoto has ordered a PSG. She is to f/u with him and w Dr Gala Romney next week. She will probably need to be started on meds - tells me she benefited from adenosine during her cath, so a PDE-5 would be a good choice (or Ca++ blocker).   Dyspnea Certainly PAH could be a player here, but she has notable tracheal deviation and narrowing on CT from the goiter.  - refer to ENT  - PFT - thyroid studies - rov 1 month

## 2012-06-12 NOTE — Addendum Note (Signed)
Addended by: Orma Flaming D on: 06/12/2012 04:59 PM   Modules accepted: Orders

## 2012-06-12 NOTE — Assessment & Plan Note (Signed)
Certainly PAH could be a player here, but she has notable tracheal deviation and narrowing on CT from the goiter.  - refer to ENT  - PFT - thyroid studies - rov 1 month

## 2012-06-14 ENCOUNTER — Encounter: Payer: BC Managed Care – PPO | Admitting: Nurse Practitioner

## 2012-06-17 ENCOUNTER — Ambulatory Visit (HOSPITAL_COMMUNITY)
Admission: RE | Admit: 2012-06-17 | Discharge: 2012-06-17 | Disposition: A | Discharge status: Home / Self Care | Payer: BC Managed Care – PPO | Source: Ambulatory Visit | Attending: Internal Medicine | Admitting: Internal Medicine

## 2012-06-17 ENCOUNTER — Encounter (HOSPITAL_COMMUNITY): Payer: Self-pay

## 2012-06-17 VITALS — BP 144/92 | HR 88 | Wt 181.8 lb

## 2012-06-17 DIAGNOSIS — I2789 Other specified pulmonary heart diseases: Secondary | ICD-10-CM | POA: Insufficient documentation

## 2012-06-17 DIAGNOSIS — I272 Pulmonary hypertension, unspecified: Secondary | ICD-10-CM

## 2012-06-17 MED ORDER — MACITENTAN 10 MG PO TABS: 10.0000 mg | ORAL_TABLET | Freq: Every day | ORAL | Status: DC

## 2012-06-17 NOTE — Assessment & Plan Note (Addendum)
Patient seen and examined with Ulyess Blossom, PA-C. We discussed all aspects of the encounter. Echo and cath results reviewed personally. I agree with the assessment as stated above. My thoughts below.  Olivia Washington has moderate PAH with mild RV strain in setting of CREST syndrome. Symptomatically class III. She demonstrated vasoreactivity on recent cath and was started on diltiazem. Chest CT without any evidence of PE or ILD. I walked her today in clinic and sats dropped from 97% at rest to 90% with hall walk. 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 Agree with diltiazem. We discussed the various forms of targeted PAH treatment and have chosen to add macitentan 10mg  daily. We will contact the [facility] to arrange. We discussed need for formal and routine echos. Also discussed likely need for combination therapy in future. Currently volume status ok but may need diuretic in future.  Agree with formal PFTs to complete w/u as per Dr. Delton Coombes. She is also currently being evaluated by ENT due to large goiter. We will see her back on February 3rd in conjunction with Dr. Elease Hashimoto.

## 2012-06-17 NOTE — Progress Notes (Signed)
Referring Physician: Dr. Elease Hashimoto Primary Care: Dr. Vernona Rieger Primary Cardiologist:  Dr. Elease Hashimoto Rheumatologist: Dr. Dareen Piano ENT: Dr. Annalee Genta  HPI: Olivia Washington is a 67 y.o. 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  Non-smoker.  PH Work up: TTE 05/13/12: LVEF 55-60%.  Grade 1 diastolic dysfunction.  Ventricular septum with diastolic flattening.  LA mildly dilated.  RV mildly dilated with systolic function mod reduced.  RA mildly dilated.  Atrial septum bowed right to left.  PAPP 54 mmHg. CT scan chest 12/3/130 showed no ILD, no acute or chronic PE but some dilation of the PA's.  Left thyroid enlargement with rightward tracheal deviation (ENT eval pending) VQ scan 05/27/12: normal, no PE  RHC  05/24/12 RA: 11/8/6  RV: 86/7  PCWP: 8/8/5  PA: 78/27 (47)  Cardiac Output  Thermodilution: 3.6 with index of 1.9  Fick : 5.9 with index of 3.1  11.6 woods units At final adenosine dose  76/28 (46)  CO (thermo) = 4.3 with index of 2.3  9.5 Woods units LHC: normal cors  PFTs and sleep study scheduled  She presents to the [facility] clinic today for follow up.  She is here with her husband.  She notes she has felt better since catheterization.  She notes her dyspnea with exertion has been stable over the last 6 months.  No dizziness/syncope.  No chest pain.  She denies edema.  Raynaud's symptoms are intermittent.    Ambulated in the halls and O2 fell to 90%.   Review of Systems: [y] = yes, [ ]  = no   General: Weight gain [ ] ; Weight loss [ ] ; Anorexia [ ] ; Fatigue [ ] ; Fever [ ] ; Chills [ ] ; Weakness [ ]   Cardiac: Chest pain/pressure [ ] ; Resting SOB [ ] ; Exertional SOB [ y]; Orthopnea [ ] ; Pedal [place] ]; Palpitations [ ] ; Syncope [ ] ; Presyncope [ ] ; Paroxysmal nocturnal dyspnea[ ]   Pulmonary: Cough [ ] ; Wheezing[ ] ; Hemoptysis[ ] ; Sputum [ ] ; Snoring [ ]   GI: Vomiting[ ] ; Dysphagia[ ] ;  Melena[ ] ; Hematochezia [ ] ; Heartburn[ ] ; Abdominal pain [ ] ; Constipation [ ] ; Diarrhea [ ] ; BRBPR [ ]   GU: Hematuria[ ] ; Dysuria [ ] ; Nocturia[ ]   Vascular: Pain in legs with walking [ ] ; Pain in feet with lying flat [ ] ; Non-healing sores [ ] ; Stroke [ ] ; TIA [ ] ; Slurred speech [ ] ;  Neuro: Headaches[ ] ; Vertigo[ ] ; Seizures[ ] ; Paresthesias[ ] ;Blurred vision [ ] ; Diplopia [ ] ; Vision changes [ ]   Ortho/Skin: Arthritis [ ] ; Joint pain [ ] ; Muscle pain [ ] ; Joint swelling [ ] ; Back Pain [ ] ; Rash [ ]   Psych: Depression[ ] ; Anxiety[ ]   Heme: Bleeding problems [ ] ; Clotting disorders [ ] ; Anemia [ ]   Endocrine: Diabetes [ ] ; Thyroid dysfunction[ ]    Past Medical History  Diagnosis Date  . Hypertension   . Migraine   . Hyperlipidemia   . GERD (gastroesophageal reflux disease)   . Thyroid nodule   . CREST (calcinosis, Raynaud's phenomenon, esophageal dysfunction, sclerodactyly, telangiectasia)   . CREST syndrome   . Allergy history unknown   . SOB (shortness of breath)   . Heart palpitations   . Osteoarthrosis     unspecified whether generalized or localized  . Hypertension   . Pulmonary hypertension     Current Outpatient Prescriptions  Medication Sig Dispense Refill  . amitriptyline (ELAVIL) 10 MG tablet  Take 10 mg by mouth at bedtime.        Marland Kitchen aspirin 81 MG tablet Take 81 mg by mouth daily.        . Calcium-Vitamin D-Vitamin K 500-500-40 MG-UNT-MCG CHEW Chew by mouth 2 (two) times daily.      . cyclobenzaprine (FLEXERIL) 10 MG tablet Take 10 mg by mouth as needed.       . diltiazem (CARDIZEM CD) 240 MG 24 hr capsule Take 240 mg by mouth daily.      Marland Kitchen HYDROcodone-acetaminophen (VICODIN) 5-500 MG per tablet Take 1 tablet by mouth as needed. migraines      . ibuprofen (ADVIL,MOTRIN) 800 MG tablet Take 800 mg by mouth as needed.       . lansoprazole (PREVACID) 30 MG capsule Take 30 mg by mouth 2 (two) times daily.        Marland Kitchen loratadine (CLARITIN) 10 MG tablet Take 10 mg by mouth  daily.        . meclizine (ANTIVERT) 25 MG tablet Take 25 mg by mouth 3 (three) times daily as needed.        . naproxen (NAPROSYN) 500 MG tablet Take 500 mg by mouth as needed.       . RED YEAST RICE EXTRACT PO Take 600 mg by mouth 2 (two) times daily.       . SUMAtriptan (IMITREX) 50 MG tablet Take 50 mg by mouth as needed.       . vitamin E (VITAMIN E) 400 UNIT capsule Take 400 Units by mouth daily.          Allergies  Allergen Reactions  . Atorvastatin Other (See Comments)    Muscle aches  . Cephalexin   . Chlorzoxazone   . Cortisporin (Neomycin-Polymyxin-Hc)     Blister   . Macrodantin   . Neomycin   . Neomycin-Polymyxin-Hc   . Penicillins   . Pravastatin Other (See Comments)    Muscle aches   . Septra (Bactrim)   . Simvastatin Other (See Comments)    Muscle aches   . Sulfa Drugs Cross Reactors   . Tape   . Zithromax (Azithromycin Dihydrate)     History   Social History  . Marital Status: Married    Spouse Name: N/A    Number of Children: 2  . Years of Education: N/A   Occupational History  . Librarian     Social History Main Topics  . Smoking status: Never Smoker   . Smokeless tobacco: Never Used  . Alcohol Use: No  . Drug Use: No  . Sexually Active: Not Currently   Other Topics Concern  . Not on file   Social History Narrative  . No narrative on file    Family History  Problem Relation Age of Onset  . Stroke Mother   . Hypertension Mother   . Hyperlipidemia Mother   . Diabetes Father   . Heart failure Father   . Hyperlipidemia Brother   . Hypertension Brother   . Cancer Father     34    PHYSICAL EXAM: Filed Vitals:   06/17/12 0913  BP: 144/92  Pulse: 88  Weight: 181 lb 12.8 oz (82.464 kg)  SpO2: 97%    General:  Well appearing. No respiratory difficulty HEENT: normal Neck: supple. no JVD. Carotids 2+ bilat; no bruits.  Thyroid goiter noted, no stridor.  Cor: PMI nondisplaced. Regular rate & rhythm. Prominent P2.  No RV lift.    Lungs: clear Abdomen:  soft, nontender, nondistended. No hepatosplenomegaly. No bruits or masses. Good bowel sounds. Extremities: no cyanosis, clubbing, rash, trace edema Neuro: alert & oriented x 3, cranial nerves grossly intact. moves all 4 extremities w/o difficulty. Affect pleasant.   ASSESSMENT & PLAN:

## 2012-06-18 ENCOUNTER — Other Ambulatory Visit: Payer: Self-pay | Admitting: Otolaryngology

## 2012-06-18 ENCOUNTER — Ambulatory Visit (HOSPITAL_COMMUNITY)
Admission: RE | Admit: 2012-06-18 | Discharge: 2012-06-18 | Disposition: A | Discharge status: Home / Self Care | Payer: BC Managed Care – PPO | Source: Ambulatory Visit | Attending: Emergency Medicine | Admitting: Emergency Medicine

## 2012-06-18 DIAGNOSIS — R0989 Other specified symptoms and signs involving the circulatory and respiratory systems: Secondary | ICD-10-CM | POA: Insufficient documentation

## 2012-06-18 DIAGNOSIS — R0600 Dyspnea, unspecified: Secondary | ICD-10-CM

## 2012-06-18 DIAGNOSIS — E079 Disorder of thyroid, unspecified: Secondary | ICD-10-CM

## 2012-06-18 DIAGNOSIS — R0609 Other forms of dyspnea: Secondary | ICD-10-CM | POA: Insufficient documentation

## 2012-06-18 MED ORDER — ALBUTEROL SULFATE (5 MG/ML) 0.5% IN NEBU
2.5000 mg | INHALATION_SOLUTION | Freq: Once | RESPIRATORY_TRACT | Status: AC
  Administered 2012-06-18: 2.5 mg via RESPIRATORY_TRACT

## 2012-06-21 ENCOUNTER — Ambulatory Visit (HOSPITAL_COMMUNITY)
Admission: RE | Admit: 2012-06-21 | Discharge: 2012-06-21 | Disposition: A | Discharge status: Home / Self Care | Payer: BC Managed Care – PPO | Source: Ambulatory Visit | Attending: Internal Medicine | Admitting: Internal Medicine

## 2012-06-21 VITALS — BP 138/68 | HR 99 | Wt 182.0 lb

## 2012-06-21 DIAGNOSIS — R0989 Other specified symptoms and signs involving the circulatory and respiratory systems: Secondary | ICD-10-CM | POA: Insufficient documentation

## 2012-06-21 DIAGNOSIS — I2789 Other specified pulmonary heart diseases: Secondary | ICD-10-CM | POA: Insufficient documentation

## 2012-06-21 DIAGNOSIS — R0600 Dyspnea, unspecified: Secondary | ICD-10-CM

## 2012-06-21 DIAGNOSIS — I272 Pulmonary hypertension, unspecified: Secondary | ICD-10-CM

## 2012-06-21 DIAGNOSIS — R0609 Other forms of dyspnea: Secondary | ICD-10-CM | POA: Insufficient documentation

## 2012-06-21 NOTE — Progress Notes (Addendum)
Pt in for 6 min walk test at the beginning of test O2 sat 98% on RA and HR 99, pt ambulated 1150 ft, O2 sat did drop to 82% on RA and pt was placed on 2L O2 via [place] and O2 sat improved to 92%.  Ulyess Blossom, PA aware of O2 sat and pt will be referred to [facility] for oxygen.  Pt also completed and signed the [employer] enrollment form.  Attending: Functional status OK. Significant hypoxemia with exertion. Agree with supplemental O2 - which has been arranged. Await results of PFTs. Paperwork for macitentan completed. Would repeat 6 MW in 2 months. Low threshold to start combination therapy.   Note forwarded to Drs. Nahser and Byrum.   Jenaro Souder,MD 9:02 PM

## 2012-06-22 NOTE — Addendum Note (Signed)
Encounter addended by: Dolores Patty, MD on: 06/22/2012  9:04 PM<BR>     Documentation filed: Notes Section

## 2012-06-24 ENCOUNTER — Ambulatory Visit: Payer: BC Managed Care – PPO | Admitting: Cardiovascular Disease

## 2012-06-25 ENCOUNTER — Ambulatory Visit
Admission: RE | Admit: 2012-06-25 | Discharge: 2012-06-25 | Disposition: A | Discharge status: Home / Self Care | Payer: BC Managed Care – PPO | Source: Ambulatory Visit | Attending: Otolaryngology | Admitting: Otolaryngology

## 2012-06-25 ENCOUNTER — Other Ambulatory Visit (HOSPITAL_COMMUNITY)
Admission: RE | Admit: 2012-06-25 | Discharge: 2012-06-25 | Disposition: A | Discharge status: Home / Self Care | Payer: BC Managed Care – PPO | Source: Ambulatory Visit | Attending: Interventional Radiology | Admitting: Interventional Radiology

## 2012-06-25 DIAGNOSIS — E079 Disorder of thyroid, unspecified: Secondary | ICD-10-CM

## 2012-06-25 DIAGNOSIS — E041 Nontoxic single thyroid nodule: Secondary | ICD-10-CM | POA: Insufficient documentation

## 2012-06-26 ENCOUNTER — Ambulatory Visit (HOSPITAL_BASED_OUTPATIENT_CLINIC_OR_DEPARTMENT_OTHER): Payer: BC Managed Care – PPO | Attending: Cardiovascular Disease | Admitting: Radiology

## 2012-06-26 VITALS — Ht 65.0 in | Wt 185.0 lb

## 2012-06-26 DIAGNOSIS — G4710 Hypersomnia, unspecified: Secondary | ICD-10-CM | POA: Insufficient documentation

## 2012-06-26 DIAGNOSIS — R259 Unspecified abnormal involuntary movements: Secondary | ICD-10-CM | POA: Insufficient documentation

## 2012-06-26 DIAGNOSIS — G4733 Obstructive sleep apnea (adult) (pediatric): Secondary | ICD-10-CM

## 2012-06-26 DIAGNOSIS — I272 Pulmonary hypertension, unspecified: Secondary | ICD-10-CM

## 2012-07-03 DIAGNOSIS — G4730 Sleep apnea, unspecified: Secondary | ICD-10-CM

## 2012-07-03 DIAGNOSIS — G4710 Hypersomnia, unspecified: Secondary | ICD-10-CM

## 2012-07-03 NOTE — Procedures (Signed)
NAME:  Olivia Washington, Olivia Washington NO.:  [id number]  MEDICAL RECORD NO.:  [id number]          PATIENT TYPE:  OUT  LOCATION:  SLEEP CENTER                 FACILITY:  [facility]  PHYSICIAN:  Barbaraann Share, MD,FCCPDATE OF BIRTH:  09/08/1947  DATE OF STUDY:  06/26/2012                           NOCTURNAL POLYSOMNOGRAM  REFERRING PHYSICIAN:  Vesta Mixer, M.D.  INDICATION FOR STUDY:  Hypersomnia with sleep apnea.  EPWORTH SLEEPINESS SCORE:  4.  MEDICATIONS:  SLEEP ARCHITECTURE:  The patient had a total sleep time of 305 minutes with no slow-wave sleep and only 36 minutes of REM.  Sleep onset latency was prolonged at 97 minutes and REM onset was normal at 54 minutes. Sleep efficiency was poor at 66%.  RESPIRATORY DATA:  The patient was noted to have two apneas and two obstructive hypopneas giving her an apnea-hypopnea index of only 0.8 events per hour.  There was minimal snoring noted during the night.  OXYGEN DATA:  The patient had transient oxygen desaturation as low as 82% during the night, and this occurred during REM.  She spent only 23 minutes throughout the entire night less than 88%.  CARDIAC DATA:  No clinically significant arrhythmias were noted.  MOVEMENT-PARASOMNIA:  The patient was noted to have 60 periodic limb movements, but only 2 per hour resulted in arousal or awakening.  There were no abnormal behavior seen.  IMPRESSIONS-RECOMMENDATIONS: 1. Small numbers of obstructive events which do not meet the AHI     criteria for the obstructive sleep apnea syndrome. 2. Transient REM associated desaturation as low as 82% during the     night.  However, the patient only spent 23 minutes the entire night     less than 88%.  Clinical correlation is suggested regarding     whether she will or will not benefit from oxygen nocturnally. 3. Small numbers of periodic limb movements with only mild sleep     disruption.  This more than likely is not clinically  significant.     Barbaraann Share, MD,FCCP Diplomate, American Board of Sleep Medicine    [facility]  D:  07/03/2012 09:44:39  T:  07/03/2012 10:48:45  Job:  [id number]

## 2012-07-08 ENCOUNTER — Encounter (HOSPITAL_COMMUNITY): Payer: Self-pay

## 2012-07-08 ENCOUNTER — Ambulatory Visit (HOSPITAL_COMMUNITY)
Admission: RE | Admit: 2012-07-08 | Discharge: 2012-07-08 | Disposition: A | Discharge status: Home / Self Care | Payer: BC Managed Care – PPO | Source: Ambulatory Visit | Attending: Internal Medicine | Admitting: Internal Medicine

## 2012-07-08 VITALS — BP 128/86 | HR 106 | Wt 182.0 lb

## 2012-07-08 DIAGNOSIS — I2789 Other specified pulmonary heart diseases: Secondary | ICD-10-CM | POA: Insufficient documentation

## 2012-07-08 DIAGNOSIS — I272 Pulmonary hypertension, unspecified: Secondary | ICD-10-CM

## 2012-07-08 MED ORDER — FUROSEMIDE 20 MG PO TABS: ORAL_TABLET | ORAL | Status: DC

## 2012-07-08 MED ORDER — POTASSIUM CHLORIDE ER 10 MEQ PO TBCR: EXTENDED_RELEASE_TABLET | ORAL | Status: DC

## 2012-07-08 NOTE — Assessment & Plan Note (Addendum)
Continue Macitentan at 10 mg daily and will see if she improves. . Volume status elevated. Add lasix 20 mg Monday-Wednesday- Friday.  Ambulated 1 minute and oxygen saturation dropped to 87%. Recommend oxygen with exertion to maintain oxygen saturation > 88%.  Refer to pulmonary rehab. Follow up in 1 month.  Patient seen and examined with Tonye Becket, NP. We discussed all aspects of the encounter. I agree with the assessment and plan as stated above. Just started macitentan. No evidence of early improvement. Will continue therapy. She is volume overloaded. Will start lasix. Reinforced need for daily weights and reviewed use of sliding scale diuretics. Described mechanism of hypoxic vasoconstriction. Emphasized need to use oxygen with all exertion. Suggested getting home pulse oximeter to monitor. Refer to pulmonary rehab. See back in 4 weeks. Will try to coordinate visit with Dr. Elease Hashimoto.

## 2012-07-08 NOTE — Progress Notes (Signed)
Patient ID: Olivia Washington, female   DOB: [birth year], 67 y.o.   MRN: [id number]  Referring Physician: Dr. Elease Hashimoto  Primary Care: Dr. Vernona Rieger  Primary Cardiologist: Dr. Elease Hashimoto  Rheumatologist: Dr. Dareen Piano  ENT: Dr. Annalee Genta   HPI:  Olivia Washington is a 67 y.o. 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 Non-smoker.   PH Work up:  TTE 05/13/12: LVEF 55-60%. Grade 1 diastolic dysfunction. Ventricular septum with diastolic flattening. LA mildly dilated. RV mildly dilated with systolic function mod reduced. RA mildly dilated. Atrial septum bowed right to left. PAPP 54 mmHg.  CT scan chest 12/3/130 showed no ILD, no acute or chronic PE but some dilation of the PA's. Left thyroid enlargement with rightward tracheal deviation (ENT eval pending)  VQ scan 05/27/12: normal, no PE  RHC 05/24/12  RA: 11/8/6  RV: 86/7  PCWP: 8/8/5  PA: 78/27 (47)  Cardiac Output  Thermodilution: 3.6 with index of 1.9  Fick : 5.9 with index of 3.1  11.6 woods units  At final adenosine dose  76/28 (46)  CO (thermo) = 4.3 with index of 2.3  9.5 Woods units  LHC: normal cors  PFTs and sleep study completed  07/03/12 Sleep study 1. Small numbers of obstructive events which do not meet the AHI  criteria for the obstructive sleep apnea syndrome.  2. Transient REM associated desaturation as low as 82% during the  night. However, the patient only spent 23 minutes the entire night  less than 88%. Clinical correlation is suggested regarding  whether she will or will not benefit from oxygen nocturnally.  3. Small numbers of periodic limb movements with only mild sleep  disruption. This more than likely is not clinically significant.  06/22/12 Beginning of test O2 sat 98% on RA and HR 99, pt ambulated 1150 ft, O2 sat did drop to 82% on RA and pt was placed on 2L O2 via [place] and O2 sat improved to 92%.   She presents  for follow up. Last week she started Macitentan 10 mg daily.  HR 111 BP 125/77. Started using oxygen last night and slept all night. Complains of extreme fatigue. Exertional dyspnea. Denies dizziness/syncope. Weight at home 182 pounds at home. Works full time as Comptroller.     ROS: All systems negative except as listed in HPI, PMH and Problem List.  Past Medical History  Diagnosis Date  . Hypertension   . Migraine   . Hyperlipidemia   . GERD (gastroesophageal reflux disease)   . Thyroid nodule   . CREST (calcinosis, Raynaud's phenomenon, esophageal dysfunction, sclerodactyly, telangiectasia)   . CREST syndrome   . Allergy history unknown   . SOB (shortness of breath)   . Heart palpitations   . Osteoarthrosis     unspecified whether generalized or localized  . Hypertension   . Pulmonary hypertension     Current Outpatient Prescriptions  Medication Sig Dispense Refill  . amitriptyline (ELAVIL) 10 MG tablet Take 10 mg by mouth at bedtime.        Marland Kitchen aspirin 81 MG tablet Take 81 mg by mouth daily.        . Calcium-Vitamin D-Vitamin K 500-500-40 MG-UNT-MCG CHEW Chew by mouth 2 (two) times daily.      . cyclobenzaprine (FLEXERIL) 10 MG tablet Take 10 mg by mouth as needed.       . diltiazem (CARDIZEM CD) 240 MG 24 hr capsule Take  240 mg by mouth daily.      Marland Kitchen HYDROcodone-acetaminophen (VICODIN) 5-500 MG per tablet Take 1 tablet by mouth as needed. migraines      . ibuprofen (ADVIL,MOTRIN) 800 MG tablet Take 800 mg by mouth as needed.       . lansoprazole (PREVACID) 30 MG capsule Take 30 mg by mouth 2 (two) times daily.        Marland Kitchen loratadine (CLARITIN) 10 MG tablet Take 10 mg by mouth daily.        . Macitentan (OPSUMIT) 10 MG TABS Take 10 mg by mouth daily.  30 tablet    . meclizine (ANTIVERT) 25 MG tablet Take 25 mg by mouth 3 (three) times daily as needed.        . naproxen (NAPROSYN) 500 MG tablet Take 500 mg by mouth as needed.       . RED YEAST RICE EXTRACT PO Take 600 mg by mouth 2  (two) times daily.       . SUMAtriptan (IMITREX) 50 MG tablet Take 50 mg by mouth as needed.       . vitamin E (VITAMIN E) 400 UNIT capsule Take 400 Units by mouth daily.           PHYSICAL EXAM: Filed Vitals:   07/08/12 1446  BP: 128/86  Pulse: 106  Weight: 182 lb (82.555 kg)  SpO2: 94%    General:  Well appearing. No resp difficulty (Husband present) HEENT: normal Neck: supple. JVP 9-10. Carotids 2+ bilaterally; no bruits. No lymphadenopathy or thryomegaly appreciated. Cor: PMI normal. Regular rate & rhythm. No rubs, gallop 2/6 TR Lungs: clear Abdomen: soft, nontender, nondistended. No hepatosplenomegaly. No bruits or masses. Good bowel sounds. Extremities: no cyanosis, clubbing, rash, R and LLE 1-2+ edema Neuro: alert & orientedx3, cranial nerves grossly intact. Moves all 4 extremities w/o difficulty. Affect pleasant.      ASSESSMENT & PLAN:

## 2012-07-08 NOTE — Patient Instructions (Addendum)
Take Lasix 20 mg Mon-Wed-Fri  Take KDUR 10 meq Monday-Wednesday-Friday  Follow up in 1 months  If you are not feeling any better please call HF clinic [phone]

## 2012-07-16 ENCOUNTER — Encounter: Payer: Self-pay | Admitting: Emergency Medicine

## 2012-07-16 ENCOUNTER — Ambulatory Visit (INDEPENDENT_AMBULATORY_CARE_PROVIDER_SITE_OTHER): Payer: BC Managed Care – PPO | Admitting: Emergency Medicine

## 2012-07-16 VITALS — BP 110/72 | HR 93 | Temp 98.3°F | Ht 65.0 in | Wt 185.6 lb

## 2012-07-16 DIAGNOSIS — I272 Pulmonary hypertension, unspecified: Secondary | ICD-10-CM

## 2012-07-16 DIAGNOSIS — I2789 Other specified pulmonary heart diseases: Secondary | ICD-10-CM

## 2012-07-16 NOTE — Progress Notes (Signed)
Subjective:    Patient ID: Olivia Washington, female    DOB: Apr 11, 1946, 67 y.o.   MRN: [id number]  HPI 67 yo woman, never smoker, followed by Dr Elease Hashimoto for HTN. Has hx CREST dx 84yrs ago (hands, esophageal strictures), hyperlipidemia, OA, GERD s/p lap Nissen. Has been experiencing dyspnea since November '13, progressive over about several months. Manifested as exertional dyspnea. Other sx included wheezing (could be heard by others), a single episode of chest heaviness. Still has some GERD despite sgy, on double PPI. She has undergone cardiac testing that was reassuring for ischemia, showed evidence for PAH. A CT scan chest 12/30 showed no ILD, no acute or chronic PE but some dilation of the PA's. Notably on that study there was a thyroid lesion that caused R deviation of the trachea.   ROV 07/16/12 -- hx CREST syndrome with associated esophageal sx (despite Nissen), progressive dyspnea and PAH discovered, R heart cath . PSG from 07/03/12 >> no OSA and 23 minutes of SpO2 < 88%. ENT eval of a thyroid nodule >> . PFT 1/14 grossly normal, possible mild AFL. She was started on macitentan by Dr Gala Romney.  6 minute walk was done in January, showed desat on exertion. Her SOB seems to be better (on medication about two weeks). She is interested in pulm rehab.    Review of Systems  Constitutional: Negative for fever and unexpected weight change.  HENT: Positive for sore throat and trouble swallowing. Negative for ear pain, nosebleeds, congestion, rhinorrhea, sneezing, dental problem, postnasal drip and sinus pressure.   Eyes: Negative for redness and itching.  Respiratory: Positive for cough, shortness of breath and wheezing. Negative for chest tightness.   Cardiovascular: Positive for palpitations. Negative for leg swelling.  Gastrointestinal: Negative for nausea and vomiting.  Genitourinary: Negative for dysuria.  Musculoskeletal: Negative for joint swelling.  Skin: Negative for rash.  Neurological:  Negative for headaches.  Hematological: Does not bruise/bleed easily.  Psychiatric/Behavioral: Negative for dysphoric mood. The patient is not nervous/anxious.        Objective:   Physical Exam  Filed Vitals:   07/16/12 1502  BP: 110/72  Pulse: 93  Temp: 98.3 F (36.8 C)   Gen: Pleasant, well-nourished, in no distress,  normal affect  ENT: No lesions,  mouth clear,  oropharynx clear, no postnasal drip, no stridor, very hoarse voice  Neck: No JVD, no TMG, no carotid bruits, palpable L goiter  Lungs: No use of accessory muscles, no dullness to percussion, clear without rales or rhonchi  Cardiovascular: RRR, heart sounds normal, no murmur or gallops, no peripheral edema  Musculoskeletal: No deformities, no cyanosis or clubbing  Neuro: alert, non focal  Skin: Warm, no lesions or rashes   CT ANGIOGRAPHY CHEST  06/03/12 --  Technique: Multidetector CT imaging of the chest using the  standard protocol during bolus administration of intravenous  contrast. Multiplanar reconstructed images including MIPs were  obtained and reviewed to evaluate the vascular anatomy.  Contrast: 80mL OMNIPAQUE IOHEXOL 350 MG/ML SOLN  Comparison: None.  Findings: Good contrast opacification of the pulmonary artery  branches. Main pulmonary artery is dilated, 3.8 cm just proximal to  its bifurcation. Right pulmonary artery dilated at 2.8 cm diameter,  left 2.3 cm. There is no mural thickening, web, stenosis, or other  stigmata of chronic pulmonary emboli. No acute filling defects are  identified. Adequate contrast opacification of the thoracic aorta  with no evidence of dissection, aneurysm, or stenosis. There is  classic 3-vessel brachiocephalic arch anatomy. No  pleural  effusion. Tiny pericardial effusion. Enlarged prevascular nodes  up to 17 mm short axis diameter. Borderline enlarged Subcarinal  and right hilar lymph nodes. There is fluid distention of the  esophagus with a small hiatal  hernia. There is asymmetric  enlargement of the left lobe of the thyroid with coarse  calcifications and low attenuation regions, resulting in rightward  tracheal deviation and some mild tracheal narrowing. Visualized  portions of upper abdomen unremarkable. Lungs are clear, some  images degraded by patient breathing during the acquisition.  Minimal spurring in the thoracic spine.  IMPRESSION:  1. Dilated central pulmonary arteries, with no evidence of acute or  chronic pulmonary emboli.  2. Nonspecific mediastinal and borderline right hilar adenopathy.  3. Left thyroid enlargement with rightward tracheal deviation.  Recommend further evaluation with thyroid ultrasound. If patient  is clinically hyperthyroid, consider nuclear medicine thyroid  uptake and scan.   TTE 05/13/12 Study Conclusions  - Left ventricle: The cavity size was normal. Wall thickness was increased in a pattern of mild LVH. Systolic function was normal. The estimated ejection fraction was in the range of 55% to 60%. Regional wall motion abnormalities cannot be excluded. Doppler parameters are consistent with abnormal left ventricular relaxation (grade 1 diastolic dysfunction). - Ventricular septum: The contour showed diastolic flattening and systolic flattening. - Left atrium: The atrium was mildly dilated. - Right ventricle: The cavity size was mildly dilated. Systolic function was moderately reduced. - Right atrium: The atrium was mildly dilated. - Atrial septum: The septum bowed from right to left, consistent with increased right atrial pressure. - Pulmonary arteries: Systolic pressure was moderately to severely increased. PA peak pressure: 54mm Hg (S). - Pericardium, extracardiac: A small pericardial effusion was identified. Impressions:  - RA and RV enlargement; findings c/w significant pulmonary hypertension.    Stress Myoview 05/10/12 Impression  Exercise Capacity: Lexiscan with no exercise.   BP Response: Normal blood pressure response.  Clinical Symptoms: There is dyspnea.  ECG Impression: No significant ST segment change suggestive of ischemia.  Comparison with Prior Nuclear Study: No previous nuclear study performed  Overall Impression: Low risk stress nuclear study with a small, mild, fixed distal anterior defect consistent with soft tissue attenuation; no ischemia. There appears to be RV dysfunction on gated images; suggest echo if clinically indicated; CRO right breast lump on raw images; suggest clinical correlation and mammogram.  LV Ejection Fraction: 68%. LV Wall Motion: NL LV Function; NL Wall Motion     Assessment & Plan:  Pulmonary HTN Please start wearing oxygen at 2L/min with all exertion and while sleeping Continue your current medications  Follow with Dr Gala Romney as planned, assess improvement clinically, 6 minute walk and TTE Agree with pulm rehab Follow with Dr Delton Coombes in 6 months or sooner if you have any problems

## 2012-07-16 NOTE — Assessment & Plan Note (Addendum)
Please start wearing oxygen at 2L/min with all exertion and while sleeping Continue your current medications  Follow with Dr Gala Romney as planned, assess improvement clinically, 6 minute walk and TTE Agree with pulm rehab Follow with Dr Delton Coombes in 6 months or sooner if you have any problems

## 2012-07-16 NOTE — Patient Instructions (Addendum)
Please start wearing oxygen at 2L/min with all exertion and while sleeping Continue your current medications  Follow with Dr Gala Romney as planned Follow with Dr Delton Coombes in 6 months or sooner if you have any problems

## 2012-08-05 ENCOUNTER — Ambulatory Visit (HOSPITAL_COMMUNITY)
Admission: RE | Admit: 2012-08-05 | Discharge: 2012-08-05 | Disposition: A | Discharge status: Home / Self Care | Payer: BC Managed Care – PPO | Source: Ambulatory Visit | Attending: Internal Medicine | Admitting: Internal Medicine

## 2012-08-05 ENCOUNTER — Other Ambulatory Visit (HOSPITAL_COMMUNITY): Payer: Self-pay | Admitting: *Deleted

## 2012-08-05 ENCOUNTER — Encounter (HOSPITAL_COMMUNITY): Payer: Self-pay

## 2012-08-05 VITALS — BP 134/76 | HR 111 | Wt 182.1 lb

## 2012-08-05 DIAGNOSIS — I272 Pulmonary hypertension, unspecified: Secondary | ICD-10-CM

## 2012-08-05 DIAGNOSIS — I2789 Other specified pulmonary heart diseases: Secondary | ICD-10-CM | POA: Insufficient documentation

## 2012-08-05 LAB — BASIC METABOLIC PANEL
BUN: 15 mg/dL (ref 6–23)
Chloride: 100 mEq/L (ref 96–112)
GFR calc non Af Amer: 76 mL/min — ABNORMAL LOW (ref >90)
Glucose, Bld: 99 mg/dL (ref 70–99)
Potassium: 3.2 mEq/L — ABNORMAL LOW (ref 3.5–5.1)
Sodium: 139 mEq/L (ref 135–145)

## 2012-08-05 MED ORDER — POTASSIUM CHLORIDE ER 10 MEQ PO TBCR: EXTENDED_RELEASE_TABLET | ORAL | Status: DC

## 2012-08-05 NOTE — Assessment & Plan Note (Signed)
Continues with NYHA/WHO class III symptoms despite Macitentan. Will add Adcirca 20mg  and then titrate to 40mg  daily. Reinforced need to keep O2 sats >= 90% at all times. She will get pulse-ox. We will order portable condenser for her to make it easier. Volume status looks better. Will check labs today.

## 2012-08-05 NOTE — Patient Instructions (Addendum)
Start Adcirca 20 mg daily for 1 week then increase to 40 mg daily  Your physician recommends that you schedule a follow-up appointment in: 1 month

## 2012-08-05 NOTE — Telephone Encounter (Signed)
Per Tonye Becket, NP increase KCL to every day except 20 meq on Mon, Wed and Fri pt aware and agreeable

## 2012-08-05 NOTE — Progress Notes (Signed)
Patient ID: Carys Malina, female   DOB: 21-Dec-1945, 67 y.o.   MRN: [id number]  Referring Physician: Dr. Elease Hashimoto  Primary Care: Dr. Vernona Rieger  Primary Cardiologist: Dr. Elease Hashimoto  Rheumatologist: Dr. Dareen Piano  ENT: Dr. Annalee Genta   HPI:  Ms. Schader is a 67 y.o. 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 Non-smoker.   PH Work up:  TTE 05/13/12: LVEF 55-60%. Grade 1 diastolic dysfunction. Ventricular septum with diastolic flattening. LA mildly dilated. RV mildly dilated with systolic function mod reduced. RA mildly dilated. Atrial septum bowed right to left. PAPP 54 mmHg.  CT scan chest 12/3/130 showed no ILD, no acute or chronic PE but some dilation of the PA's. Left thyroid enlargement with rightward tracheal deviation (ENT eval pending)  VQ scan 05/27/12: normal, no PE  RHC 05/24/12  RA: 11/8/6  RV: 86/7  PA: 78/27 (47) PCWP: 8/8/5   Cardiac Output  Thermodilution: 3.6 with index of 1.9  Fick : 5.9 with index of 3.1  11.6 woods units  At final adenosine dose  76/28 (46)  CO (thermo) = 4.3 with index of 2.3  9.5 Woods units  LHC: normal cors  PFTs and sleep study completed  07/03/12 Sleep study 1. Small numbers of obstructive events which do not meet the AHI  criteria for the obstructive sleep apnea syndrome.  2. Transient REM associated desaturation as low as 82% during the  night. However, the patient only spent 23 minutes the entire night  less than 88%. Clinical correlation is suggested regarding  whether she will or will not benefit from oxygen nocturnally.  3. Small numbers of periodic limb movements with only mild sleep  disruption. This more than likely is not clinically significant.  06/22/12 Beginning of test O2 sat 98% on RA and HR 99, pt ambulated 1150 ft, O2 sat did drop to 82% on RA and pt was placed on 2L O2 via [place] and O2 sat improved to 92%.   She presents  for follow up. Last month she started Macitentan 10 mg daily. Still short of breath with mild exertion doesn't notice much change. Wearing oxygen with exertion and at night. Not measuring sats. Feels Raynaud's is better. Denies dizziness/syncope/edema. Weight at home 182 pounds at home. Works full time as Comptroller. Takes lasix 3x/week.      ROS: All systems negative except as listed in HPI, PMH and Problem List.  Past Medical History  Diagnosis Date  . Hypertension   . Migraine   . Hyperlipidemia   . GERD (gastroesophageal reflux disease)   . Thyroid nodule   . CREST (calcinosis, Raynaud's phenomenon, esophageal dysfunction, sclerodactyly, telangiectasia)   . CREST syndrome   . Allergy history unknown   . SOB (shortness of breath)   . Heart palpitations   . Osteoarthrosis     unspecified whether generalized or localized  . Hypertension   . Pulmonary hypertension     Current Outpatient Prescriptions  Medication Sig Dispense Refill  . amitriptyline (ELAVIL) 10 MG tablet Take 10 mg by mouth at bedtime.        Marland Kitchen aspirin 81 MG tablet Take 81 mg by mouth daily.        . Calcium-Vitamin D-Vitamin K 500-500-40 MG-UNT-MCG CHEW Chew by mouth 2 (two) times daily.      . cyclobenzaprine (FLEXERIL) 10 MG tablet Take 10 mg by mouth as needed.       Marland Kitchen  diltiazem (CARDIZEM CD) 240 MG 24 hr capsule Take 240 mg by mouth daily.      . furosemide (LASIX) 20 MG tablet Take Monday Wednesday Friday and as needed  20 tablet  3  . HYDROcodone-acetaminophen (VICODIN) 5-500 MG per tablet Take 1 tablet by mouth as needed. migraines      . ibuprofen (ADVIL,MOTRIN) 800 MG tablet Take 800 mg by mouth as needed.       . lansoprazole (PREVACID) 30 MG capsule Take 30 mg by mouth 2 (two) times daily.        Marland Kitchen loratadine (CLARITIN) 10 MG tablet Take 10 mg by mouth daily.        . Macitentan (OPSUMIT) 10 MG TABS Take 10 mg by mouth daily.  30 tablet    . meclizine (ANTIVERT) 25 MG tablet Take 25 mg by mouth 3  (three) times daily as needed.        . naproxen (NAPROSYN) 500 MG tablet Take 500 mg by mouth as needed.       . potassium chloride (K-DUR) 10 MEQ tablet Take 1 tab Monday Wednesday Friday  9 tablet  3  . RED YEAST RICE EXTRACT PO Take 600 mg by mouth 2 (two) times daily.       . SUMAtriptan (IMITREX) 50 MG tablet Take 50 mg by mouth as needed.       . vitamin E (VITAMIN E) 400 UNIT capsule Take 400 Units by mouth daily.         No current facility-administered medications for this encounter.     PHYSICAL EXAM: Filed Vitals:   08/05/12 1423  BP: 156/100  Pulse: 111  Weight: 182 lb 1.9 oz (82.609 kg)  SpO2: 93%    General:  Well appearing. No resp difficulty (Husband present) HEENT: normal Neck: supple. JVP 7. Carotids 2+ bilaterally; no bruits. No lymphadenopathy or thryomegaly appreciated. Cor: PMI normal. Regular rate & rhythm. No rubs, gallop 2/6 TR. Loud P2 Lungs: clear Abdomen: soft, nontender, nondistended. No hepatosplenomegaly. No bruits or masses. Good bowel sounds. Extremities: no cyanosis, clubbing, rash, R and LLE tr edema. Hyperemia and blanching in hands Neuro: alert & orientedx3, cranial nerves grossly intact. Moves all 4 extremities w/o difficulty. Affect pleasant.   ASSESSMENT & PLAN:

## 2012-09-05 ENCOUNTER — Encounter (HOSPITAL_COMMUNITY): Payer: BC Managed Care – PPO

## 2012-09-11 ENCOUNTER — Ambulatory Visit (HOSPITAL_COMMUNITY)
Admission: RE | Admit: 2012-09-11 | Discharge: 2012-09-11 | Disposition: A | Discharge status: Home / Self Care | Payer: BC Managed Care – PPO | Source: Ambulatory Visit | Attending: Internal Medicine | Admitting: Internal Medicine

## 2012-09-11 VITALS — BP 152/68 | HR 98 | Wt 181.8 lb

## 2012-09-11 DIAGNOSIS — M1990 Unspecified osteoarthritis, unspecified site: Secondary | ICD-10-CM | POA: Insufficient documentation

## 2012-09-11 DIAGNOSIS — K219 Gastro-esophageal reflux disease without esophagitis: Secondary | ICD-10-CM | POA: Insufficient documentation

## 2012-09-11 DIAGNOSIS — M349 Systemic sclerosis, unspecified: Secondary | ICD-10-CM | POA: Insufficient documentation

## 2012-09-11 DIAGNOSIS — I2789 Other specified pulmonary heart diseases: Secondary | ICD-10-CM | POA: Insufficient documentation

## 2012-09-11 DIAGNOSIS — I10 Essential (primary) hypertension: Secondary | ICD-10-CM | POA: Insufficient documentation

## 2012-09-11 DIAGNOSIS — R0989 Other specified symptoms and signs involving the circulatory and respiratory systems: Secondary | ICD-10-CM | POA: Insufficient documentation

## 2012-09-11 DIAGNOSIS — I272 Pulmonary hypertension, unspecified: Secondary | ICD-10-CM

## 2012-09-11 DIAGNOSIS — I789 Disease of capillaries, unspecified: Secondary | ICD-10-CM | POA: Insufficient documentation

## 2012-09-11 DIAGNOSIS — Z79899 Other long term (current) drug therapy: Secondary | ICD-10-CM | POA: Insufficient documentation

## 2012-09-11 DIAGNOSIS — R0609 Other forms of dyspnea: Secondary | ICD-10-CM | POA: Insufficient documentation

## 2012-09-11 DIAGNOSIS — Z7982 Long term (current) use of aspirin: Secondary | ICD-10-CM | POA: Insufficient documentation

## 2012-09-11 DIAGNOSIS — E785 Hyperlipidemia, unspecified: Secondary | ICD-10-CM | POA: Insufficient documentation

## 2012-09-11 DIAGNOSIS — L940 Localized scleroderma [morphea]: Secondary | ICD-10-CM | POA: Insufficient documentation

## 2012-09-11 DIAGNOSIS — K229 Disease of esophagus, unspecified: Secondary | ICD-10-CM | POA: Insufficient documentation

## 2012-09-11 DIAGNOSIS — R002 Palpitations: Secondary | ICD-10-CM | POA: Insufficient documentation

## 2012-09-11 LAB — BASIC METABOLIC PANEL
BUN: 16 mg/dL (ref 6–23)
GFR calc non Af Amer: 86 mL/min — ABNORMAL LOW (ref >90)
Glucose, Bld: 96 mg/dL (ref 70–99)
Potassium: 3.5 mEq/L (ref 3.5–5.1)

## 2012-09-11 MED ORDER — FUROSEMIDE 20 MG PO TABS: 20.0000 mg | ORAL_TABLET | Freq: Every day | ORAL | Status: DC

## 2012-09-11 NOTE — Progress Notes (Signed)
Patient ID: Olivia Washington, female   DOB: 04-23-1946, 67 y.o.   MRN: [id number] Referring Physician: Dr. Elease Hashimoto  Primary Care: Dr. Vernona Rieger  Primary Cardiologist: Dr. Elease Hashimoto  Rheumatologist: Dr. Dareen Piano  ENT: Dr. Annalee Genta   HPI:  Olivia Washington is a 67 y.o. 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 Non-smoker.   PH Work up:  TTE 05/13/12: LVEF 55-60%. Grade 1 diastolic dysfunction. Ventricular septum with diastolic flattening. LA mildly dilated. RV mildly dilated with systolic function mod reduced. RA mildly dilated. Atrial septum bowed right to left. PAPP 54 mmHg.  CT scan chest 12/3/130 showed no ILD, no acute or chronic PE but some dilation of the PA's. Left thyroid enlargement with rightward tracheal deviation (ENT eval pending)  VQ scan 05/27/12: normal, no PE  RHC 05/24/12  RA: 11/8/6  RV: 86/7  PA: 78/27 (47) PCWP: 8/8/5   Cardiac Output  Thermodilution: 3.6 with index of 1.9  Fick : 5.9 with index of 3.1  11.6 woods units  At final adenosine dose  76/28 (46)  CO (thermo) = 4.3 with index of 2.3  9.5 Woods units  LHC: normal cors  PFTs and sleep study completed  07/03/12 Sleep study 1. Small numbers of obstructive events which do not meet the AHI  criteria for the obstructive sleep apnea syndrome.  2. Transient REM associated desaturation as low as 82% during the  night. However, the patient only spent 23 minutes the entire night  less than 88%. Clinical correlation is suggested regarding  whether she will or will not benefit from oxygen nocturnally.  3. Small numbers of periodic limb movements with only mild sleep  disruption. This more than likely is not clinically significant.  06/22/12 Beginning of test O2 sat 98% on RA and HR 99, pt ambulated 1150 ft, O2 sat did drop to 82% on RA and pt was placed on 2L O2 via [place] and O2 sat improved to 92%.   She presents  for follow up. Last visit she started on Adcirca 20 mg daily but did not tolerate 40 mg of adcirca.She complained of acute dyspnea. She has tolerated 20 mg in am 20 mg in pm of Adcirca.   She continues on Macitentan. She has had one episode of acute dyspnea. Exertional dyspnea persists.  Denies PND/Orthopnea. Wearing oxygen with exertion. Weight at home 176 pounds at home. Works full time as Comptroller. Takes lasix 3x/week. Plan to move to [facility] this summer. Plans to retire this summer.      ROS: All systems negative except as listed in HPI, PMH and Problem List.  Past Medical History  Diagnosis Date  . Hypertension   . Migraine   . Hyperlipidemia   . GERD (gastroesophageal reflux disease)   . Thyroid nodule   . CREST (calcinosis, Raynaud's phenomenon, esophageal dysfunction, sclerodactyly, telangiectasia)   . CREST syndrome   . Allergy history unknown   . SOB (shortness of breath)   . Heart palpitations   . Osteoarthrosis     unspecified whether generalized or localized  . Hypertension   . Pulmonary hypertension     Current Outpatient Prescriptions  Medication Sig Dispense Refill  . amitriptyline (ELAVIL) 10 MG tablet Take 10 mg by mouth at bedtime.        Marland Kitchen aspirin 81 MG tablet Take 81 mg by mouth daily.        . Calcium-Vitamin D-Vitamin K  500-500-40 MG-UNT-MCG CHEW Chew by mouth 2 (two) times daily.      . ciprofloxacin (CIPRO) 500 MG tablet Take 500 mg by mouth 2 (two) times daily.      . cyclobenzaprine (FLEXERIL) 10 MG tablet Take 10 mg by mouth as needed.       . diltiazem (CARDIZEM CD) 240 MG 24 hr capsule Take 240 mg by mouth daily.      . furosemide (LASIX) 20 MG tablet Take Monday Wednesday Friday and as needed  20 tablet  3  . HYDROcodone-acetaminophen (VICODIN) 5-500 MG per tablet Take 1 tablet by mouth as needed. migraines      . ibuprofen (ADVIL,MOTRIN) 800 MG tablet Take 800 mg by mouth as needed.       . lansoprazole (PREVACID) 30 MG capsule Take 30 mg by  mouth 2 (two) times daily.        Marland Kitchen loratadine (CLARITIN) 10 MG tablet Take 10 mg by mouth daily.        . Macitentan (OPSUMIT) 10 MG TABS Take 10 mg by mouth daily.  30 tablet    . meclizine (ANTIVERT) 25 MG tablet Take 25 mg by mouth 3 (three) times daily as needed.        . naproxen (NAPROSYN) 500 MG tablet Take 500 mg by mouth as needed.       . potassium chloride (K-DUR) 10 MEQ tablet Take 1 tab daily except 2 tabs on Mon, Wed and Fri  40 tablet  3  . RED YEAST RICE EXTRACT PO Take 600 mg by mouth 2 (two) times daily.       . SUMAtriptan (IMITREX) 50 MG tablet Take 50 mg by mouth as needed.       . Tadalafil, PAH, (ADCIRCA) 20 MG TABS Take 1 tablet by mouth 2 (two) times daily.      . vitamin E (VITAMIN E) 400 UNIT capsule Take 400 Units by mouth daily.         No current facility-administered medications for this encounter.     PHYSICAL EXAM: Filed Vitals:   09/11/12 1537  BP: 152/68  Pulse: 98  Weight: 181 lb 12 oz (82.441 kg)  SpO2: 92%    General:  Well appearing. No resp difficulty (Husband present) HEENT: normal Neck: supple. JVP 7. Carotids 2+ bilaterally; no bruits. No lymphadenopathy or thryomegaly appreciated. Cor: PMI normal. Regular rate & rhythm. No rubs, gallop 2/6 TR. Loud P2 Lungs: clear Abdomen: soft, nontender, nondistended. No hepatosplenomegaly. No bruits or masses. Good bowel sounds. Extremities: no cyanosis, clubbing, rash, R and LLE 1+ edema. Hyperemia and blanching in hands Neuro: alert & orientedx3, cranial nerves grossly intact. Moves all 4 extremities w/o difficulty. Affect pleasant.   ASSESSMENT & PLAN:

## 2012-09-11 NOTE — Assessment & Plan Note (Addendum)
Continues with NYHA/WHO class III symptoms. Continue Adcirca 40mg  daily and Macitentan. Volume status mildly elevated. Increase lasix to 20 mg daily. Check BMET. If potassium remains low will add 12.5 mg of spironolactone daily. Hopefully she will improve some with diuresis. Follow up in 1 month with 6 MW and ECHO.

## 2012-09-11 NOTE — Patient Instructions (Addendum)
Take Lasix 20 mg daily  Follow up in 1 month with an ECHO and a 6 MW

## 2012-10-09 ENCOUNTER — Encounter: Payer: Self-pay | Admitting: Internal Medicine

## 2012-10-17 ENCOUNTER — Ambulatory Visit (HOSPITAL_COMMUNITY)
Admission: RE | Admit: 2012-10-17 | Discharge: 2012-10-17 | Disposition: A | Discharge status: Home / Self Care | Payer: BC Managed Care – PPO | Source: Ambulatory Visit | Attending: Internal Medicine | Admitting: Internal Medicine

## 2012-10-17 ENCOUNTER — Ambulatory Visit (HOSPITAL_BASED_OUTPATIENT_CLINIC_OR_DEPARTMENT_OTHER)
Admission: RE | Admit: 2012-10-17 | Discharge: 2012-10-17 | Disposition: A | Discharge status: Home / Self Care | Payer: BC Managed Care – PPO | Source: Ambulatory Visit | Attending: Internal Medicine | Admitting: Internal Medicine

## 2012-10-17 VITALS — BP 138/70 | HR 98 | Ht 65.0 in | Wt 180.8 lb

## 2012-10-17 DIAGNOSIS — I272 Pulmonary hypertension, unspecified: Secondary | ICD-10-CM

## 2012-10-17 DIAGNOSIS — I2789 Other specified pulmonary heart diseases: Secondary | ICD-10-CM

## 2012-10-17 DIAGNOSIS — I369 Nonrheumatic tricuspid valve disorder, unspecified: Secondary | ICD-10-CM

## 2012-10-17 DIAGNOSIS — I079 Rheumatic tricuspid valve disease, unspecified: Secondary | ICD-10-CM | POA: Insufficient documentation

## 2012-10-17 DIAGNOSIS — E785 Hyperlipidemia, unspecified: Secondary | ICD-10-CM | POA: Insufficient documentation

## 2012-10-17 DIAGNOSIS — R0609 Other forms of dyspnea: Secondary | ICD-10-CM | POA: Insufficient documentation

## 2012-10-17 DIAGNOSIS — R0989 Other specified symptoms and signs involving the circulatory and respiratory systems: Secondary | ICD-10-CM | POA: Insufficient documentation

## 2012-10-17 NOTE — Assessment & Plan Note (Addendum)
WHO class II-III on adcirca 40 mg and macitentan.  Have reviewed today's echo with the patient and her husband.  Her RV is responding very well to current regimen and 6 MW slightly increased.  Will continue to follow with echos.  Have also discussed sliding scale lasix for weight gain.    Patient seen and examined with Ulyess Blossom, PA-C. We discussed all aspects of the encounter. I agree with the assessment and plan as stated above. She is much improved on combination therapy. Echo images reviewed personally with her and her husband. Evidence for RV strain no longer present. about the same but she stopped in middle for about a minute to but O2 back on. Reinforced need for daily weights and reviewed use of sliding scale diuretics. Will continue current regimen with close f/u. If getting worse, will need to add inhaled agent. Continue supplemental O2.

## 2012-10-17 NOTE — Progress Notes (Signed)
*  PRELIMINARY RESULTS* Echocardiogram 2D Echocardiogram has been performed.  Olivia Washington 10/17/2012, 3:44 PM

## 2012-10-17 NOTE — Progress Notes (Signed)
Referring Physician: Dr. Elease Hashimoto  Primary Care: Dr. Vernona Rieger  Primary Cardiologist: Dr. Elease Hashimoto  Rheumatologist: Dr. Dareen Piano  ENT: Dr. Annalee Genta   HPI:  Olivia Washington is a 67 y.o. 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 Non-smoker.   PH Work up:  TTE 05/13/12: LVEF 55-60%. Grade 1 diastolic dysfunction. Ventricular septum with diastolic flattening. LA mildly dilated. RV mildly dilated with systolic function mod reduced. RA mildly dilated. Atrial septum bowed right to left. PAPP 54 mmHg.  CT scan chest 12/3/130 showed no ILD, no acute or chronic PE but some dilation of the PA's. Left thyroid enlargement with rightward tracheal deviation (ENT eval pending)  VQ scan 05/27/12: normal, no PE  RHC 05/24/12  RA: 11/8/6  RV: 86/7  PA: 78/27 (47) PCWP: 8/8/5   Cardiac Output  Thermodilution: 3.6 with index of 1.9  Fick : 5.9 with index of 3.1  11.6 woods units  At final adenosine dose  76/28 (46)  CO (thermo) = 4.3 with index of 2.3  9.5 Woods units  LHC: normal cors  PFTs and sleep study completed  07/03/12 Sleep study 1. Small numbers of obstructive events which do not meet the AHI  criteria for the obstructive sleep apnea syndrome.  2. Transient REM associated desaturation as low as 82% during the  night. However, the patient only spent 23 minutes the entire night  less than 88%. Clinical correlation is suggested regarding  whether she will or will not benefit from oxygen nocturnally.  3. Small numbers of periodic limb movements with only mild sleep  disruption. This more than likely is not clinically significant.  06/22/12 Beginning of test O2 sat 98% on RA and HR 99, pt ambulated 1150 ft, O2 sat did drop to 82% on RA and pt was placed on 2L O2 via [place] and O2 sat improved to 92%.  She presents for follow up. Last visit she lasix increased to 20 mg daily. She continued on  adcirca 40 mg daily and Macitentan.  Says she feels a lot better once she started [place].  Weight at home 175 pounds.  She denies orthopnea, PND.  No chest pain.  No syncope.    ECHO today: RV mildly dilated with normal systolic function, peak PA 54 1160 ft on O2 6L  June the 19th is her last day as a [employer].  She has plans to move [facility] this summer.     ROS: All systems negative except as listed in HPI, PMH and Problem List.  Past Medical History  Diagnosis Date  . Hypertension   . Migraine   . Hyperlipidemia   . GERD (gastroesophageal reflux disease)   . Thyroid nodule   . CREST (calcinosis, Raynaud's phenomenon, esophageal dysfunction, sclerodactyly, telangiectasia)   . CREST syndrome   . Allergy history unknown   . SOB (shortness of breath)   . Heart palpitations   . Osteoarthrosis     unspecified whether generalized or localized  . Hypertension   . Pulmonary hypertension     Current Outpatient Prescriptions  Medication Sig Dispense Refill  . amitriptyline (ELAVIL) 10 MG tablet Take 10 mg by mouth at bedtime.        Marland Kitchen aspirin 81 MG tablet Take 81 mg by mouth daily.        . Calcium-Vitamin D-Vitamin K 500-500-40 MG-UNT-MCG CHEW Chew by mouth 2 (two) times daily.      Marland Kitchen  cyclobenzaprine (FLEXERIL) 10 MG tablet Take 10 mg by mouth as needed.       . diltiazem (CARDIZEM CD) 240 MG 24 hr capsule Take 240 mg by mouth daily.      . furosemide (LASIX) 20 MG tablet Take 1 tablet (20 mg total) by mouth daily.  30 tablet  3  . HYDROcodone-acetaminophen (VICODIN) 5-500 MG per tablet Take 1 tablet by mouth as needed. migraines      . ibuprofen (ADVIL,MOTRIN) 800 MG tablet Take 800 mg by mouth as needed.       . lansoprazole (PREVACID) 30 MG capsule Take 30 mg by mouth 2 (two) times daily.        Marland Kitchen loratadine (CLARITIN) 10 MG tablet Take 10 mg by mouth daily.        . Macitentan (OPSUMIT) 10 MG TABS Take 10 mg by mouth daily.  30 tablet    . meclizine (ANTIVERT) 25 MG  tablet Take 25 mg by mouth 3 (three) times daily as needed.        . naproxen (NAPROSYN) 500 MG tablet Take 500 mg by mouth as needed.       . potassium chloride (K-DUR) 10 MEQ tablet Take 1 tab daily except 2 tabs on Mon, Wed and Fri  40 tablet  3  . RED YEAST RICE EXTRACT PO Take 600 mg by mouth 2 (two) times daily.       . SUMAtriptan (IMITREX) 50 MG tablet Take 50 mg by mouth as needed.       . Tadalafil, PAH, (ADCIRCA) 20 MG TABS Take 1 tablet by mouth 2 (two) times daily.      . vitamin E (VITAMIN E) 400 UNIT capsule Take 400 Units by mouth daily.         No current facility-administered medications for this encounter.     PHYSICAL EXAM: Filed Vitals:   10/17/12 1529  BP: 138/70  Pulse: 98  Height: 5\' 5"  (1.651 m)  Weight: 180 lb 12.8 oz (82.01 kg)    General:  Well appearing. No resp difficulty (Husband present) HEENT: normal Neck: supple. JVP 7-8. Carotids 2+ bilaterally; no bruits. No lymphadenopathy or thryomegaly appreciated. Cor: PMI normal. Regular rate & rhythm. No rubs, gallop 2/6 TR. Loud P2 Lungs: clear Abdomen: soft, nontender, nondistended. No hepatosplenomegaly. No bruits or masses. Good bowel sounds. Extremities: no cyanosis, clubbing, rash, R and LLE 1+ edema. Hyperemia and blanching in hands Neuro: alert & orientedx3, cranial nerves grossly intact. Moves all 4 extremities w/o difficulty. Affect pleasant.   ASSESSMENT & PLAN:

## 2012-10-17 NOTE — Patient Instructions (Addendum)
Follow up end of June with Dr. Gala Romney  Do the following things EVERYDAY: 1) Weigh yourself in the morning before breakfast. Write it down and keep it in a log. 2) Take your medicines as prescribed 3) Eat low salt foods-Limit salt (sodium) to 2000 mg per day.  4) Stay as active as you can everyday 5) Limit all fluids for the day to less than 2 liters

## 2012-10-17 NOTE — Progress Notes (Signed)
6 min walk test preformed, pt ambulated 1160 ft, oxygen was increased to 5L

## 2012-11-28 ENCOUNTER — Ambulatory Visit (HOSPITAL_COMMUNITY)
Admission: RE | Admit: 2012-11-28 | Discharge: 2012-11-28 | Disposition: A | Discharge status: Home / Self Care | Payer: BC Managed Care – PPO | Source: Ambulatory Visit | Attending: Internal Medicine | Admitting: Internal Medicine

## 2012-11-28 ENCOUNTER — Encounter (HOSPITAL_COMMUNITY): Payer: Self-pay

## 2012-11-28 VITALS — BP 110/69 | HR 100 | Wt 175.0 lb

## 2012-11-28 DIAGNOSIS — K219 Gastro-esophageal reflux disease without esophagitis: Secondary | ICD-10-CM | POA: Insufficient documentation

## 2012-11-28 DIAGNOSIS — I272 Pulmonary hypertension, unspecified: Secondary | ICD-10-CM

## 2012-11-28 DIAGNOSIS — I10 Essential (primary) hypertension: Secondary | ICD-10-CM | POA: Insufficient documentation

## 2012-11-28 DIAGNOSIS — M1990 Unspecified osteoarthritis, unspecified site: Secondary | ICD-10-CM | POA: Insufficient documentation

## 2012-11-28 DIAGNOSIS — M349 Systemic sclerosis, unspecified: Secondary | ICD-10-CM | POA: Insufficient documentation

## 2012-11-28 DIAGNOSIS — I2789 Other specified pulmonary heart diseases: Secondary | ICD-10-CM

## 2012-11-28 DIAGNOSIS — E785 Hyperlipidemia, unspecified: Secondary | ICD-10-CM | POA: Insufficient documentation

## 2012-11-28 NOTE — Patient Instructions (Addendum)
We will contact you in 4 months to schedule your next appointment.  

## 2012-11-28 NOTE — Assessment & Plan Note (Signed)
Doing well. Now NYHA II on combination therapy. and echo improved. Volume status looks. Reminded her her to use oxygen all the time. Will see back every 4 months. She is to contact me in the interim if feeling worse. Reinforced need for daily weights and reviewed use of sliding scale diuretics.

## 2012-11-28 NOTE — Progress Notes (Signed)
Referring Physician: Dr. Elease Hashimoto  Primary Care: Dr. Vernona Rieger  Primary Cardiologist: Dr. Elease Hashimoto  Rheumatologist: Dr. Dareen Piano  ENT: Dr. Annalee Genta   HPI:  Olivia Washington is a 67 y.o. 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 Non-smoker.   PH Work up:  TTE 05/13/12: LVEF 55-60%. Grade 1 diastolic dysfunction. Ventricular septum with diastolic flattening. LA mildly dilated. RV mildly dilated with systolic function mod reduced. RA mildly dilated. Atrial septum bowed right to left. PAPP 54 mmHg.  CT scan chest 12/3/130 showed no ILD, no acute or chronic PE but some dilation of the PA's. Left thyroid enlargement with rightward tracheal deviation (ENT eval pending)  VQ scan 05/27/12: normal, no PE  RHC 05/24/12  RA: 11/8/6  RV: 86/7  PA: 78/27 (47) PCWP: 8/8/5   Cardiac Output  Thermodilution: 3.6 with index of 1.9  Fick : 5.9 with index of 3.1  11.6 woods units  At final adenosine dose  76/28 (46)  CO (thermo) = 4.3 with index of 2.3  9.5 Woods units  LHC: normal cors  PFTs and sleep study completed  07/03/12 Sleep study 1. Small numbers of obstructive events which do not meet the AHI  criteria for the obstructive sleep apnea syndrome.  2. Transient REM associated desaturation as low as 82% during the  night. However, the patient only spent 23 minutes the entire night  less than 88%. Clinical correlation is suggested regarding  whether she will or will not benefit from oxygen nocturnally.  3. Small numbers of periodic limb movements with only mild sleep  disruption. This more than likely is not clinically significant.  06/22/12 Beginning of test O2 sat 98% on RA and HR 99, pt ambulated 1150 ft, O2 sat did drop to 82% on RA and pt was placed on 2L O2 via [place] and O2 sat improved to 92%.   ECHO 5/14: RV mildly dilated with normal systolic function, peak PA 54 1160 ft on O2  6L  She presents for follow up. Continues to take lasix 20 mg daily. Has not had to take extra. Weight stable. Using O2 all the time at 2L Track sats with pulse ox. She continues on adcirca 40 mg daily and Macitentan. Raynaud's much improved.  She denies orthopnea, PND.  No chest pain.  No syncope. Functional status is good as long as she is wearing her oxygen.    ROS: All systems negative except as listed in HPI, PMH and Problem List.  Past Medical History  Diagnosis Date  . Hypertension   . Migraine   . Hyperlipidemia   . GERD (gastroesophageal reflux disease)   . Thyroid nodule   . CREST (calcinosis, Raynaud's phenomenon, esophageal dysfunction, sclerodactyly, telangiectasia)   . CREST syndrome   . Allergy history unknown   . SOB (shortness of breath)   . Heart palpitations   . Osteoarthrosis     unspecified whether generalized or localized  . Hypertension   . Pulmonary hypertension     Current Outpatient Prescriptions  Medication Sig Dispense Refill  . amitriptyline (ELAVIL) 10 MG tablet Take 10 mg by mouth at bedtime.        Marland Kitchen aspirin 81 MG tablet Take 81 mg by mouth daily.        . Calcium-Vitamin D-Vitamin K 500-500-40 MG-UNT-MCG CHEW Chew by mouth 2 (two) times daily.      Marland Kitchen diltiazem (CARDIZEM CD) 240 MG  24 hr capsule Take 240 mg by mouth daily.      . furosemide (LASIX) 20 MG tablet Take 1 tablet (20 mg total) by mouth daily.  30 tablet  3  . HYDROcodone-acetaminophen (VICODIN) 5-500 MG per tablet Take 1 tablet by mouth as needed. migraines      . ibuprofen (ADVIL,MOTRIN) 800 MG tablet Take 800 mg by mouth as needed.       . lansoprazole (PREVACID) 30 MG capsule Take 30 mg by mouth 2 (two) times daily.        Marland Kitchen loratadine (CLARITIN) 10 MG tablet Take 10 mg by mouth daily.        . Macitentan (OPSUMIT) 10 MG TABS Take 10 mg by mouth daily.  30 tablet    . meclizine (ANTIVERT) 25 MG tablet Take 25 mg by mouth 3 (three) times daily as needed.        . naproxen (NAPROSYN)  500 MG tablet Take 500 mg by mouth as needed.       . potassium chloride (K-DUR) 10 MEQ tablet Take 1 tab daily except 2 tabs on Mon, Wed and Fri  40 tablet  3  . RED YEAST RICE EXTRACT PO Take 600 mg by mouth 2 (two) times daily.       . SUMAtriptan (IMITREX) 50 MG tablet Take 50 mg by mouth as needed.       . Tadalafil, PAH, (ADCIRCA) 20 MG TABS Take 1 tablet by mouth 2 (two) times daily.      . vitamin E (VITAMIN E) 400 UNIT capsule Take 400 Units by mouth daily.        . cyclobenzaprine (FLEXERIL) 10 MG tablet Take 10 mg by mouth as needed.        No current facility-administered medications for this encounter.     PHYSICAL EXAM: Filed Vitals:   11/28/12 1429  BP: 110/69  Pulse: 100  Weight: 175 lb (79.379 kg)  SpO2: 90%    General:  Well appearing. No resp difficulty (Husband present) HEENT: normal Neck: supple. JVP 6-7 Carotids 2+ bilaterally; no bruits. No lymphadenopathy or thryomegaly appreciated. Cor: PMI normal. Regular rate & rhythm. No rubs, gallop 2/6 brief TR. Mildly increased P2 Lungs: clear Abdomen: soft, nontender, nondistended. No hepatosplenomegaly. No bruits or masses. Good bowel sounds. Extremities: no cyanosis, clubbing, rash, trace edema. Hyperemia and blanching in hands Neuro: alert & orientedx3, cranial nerves grossly intact. Moves all 4 extremities w/o difficulty. Affect pleasant.   ASSESSMENT & PLAN:

## 2012-11-28 NOTE — Addendum Note (Signed)
Encounter addended by: Noralee Space, RN on: 11/28/2012  2:46 PM<BR>     Documentation filed: Patient Instructions Section

## 2013-01-07 ENCOUNTER — Other Ambulatory Visit (HOSPITAL_COMMUNITY): Payer: Self-pay | Admitting: *Deleted

## 2013-01-07 MED ORDER — POTASSIUM CHLORIDE ER 10 MEQ PO TBCR: EXTENDED_RELEASE_TABLET | ORAL | Status: DC

## 2013-02-14 ENCOUNTER — Telehealth (HOSPITAL_COMMUNITY): Payer: Self-pay | Admitting: Cardiology

## 2013-02-14 DIAGNOSIS — I2789 Other specified pulmonary heart diseases: Secondary | ICD-10-CM

## 2013-02-14 MED ORDER — FUROSEMIDE 20 MG PO TABS: 20.0000 mg | ORAL_TABLET | Freq: Every day | ORAL | Status: DC

## 2013-02-14 NOTE — Telephone Encounter (Signed)
Pt called to request refill #90 day supply

## 2013-03-07 ENCOUNTER — Encounter (HOSPITAL_COMMUNITY): Payer: BC Managed Care – PPO

## 2013-03-11 ENCOUNTER — Encounter (HOSPITAL_COMMUNITY): Payer: BC Managed Care – PPO

## 2013-04-08 ENCOUNTER — Ambulatory Visit (HOSPITAL_COMMUNITY)
Admission: RE | Admit: 2013-04-08 | Discharge: 2013-04-08 | Disposition: A | Discharge status: Home / Self Care | Payer: Medicare Other | Source: Ambulatory Visit | Attending: Internal Medicine | Admitting: Internal Medicine

## 2013-04-08 VITALS — BP 124/62 | HR 95 | Wt 176.0 lb

## 2013-04-08 DIAGNOSIS — I10 Essential (primary) hypertension: Secondary | ICD-10-CM

## 2013-04-08 DIAGNOSIS — I2789 Other specified pulmonary heart diseases: Secondary | ICD-10-CM | POA: Diagnosis not present

## 2013-04-08 DIAGNOSIS — I272 Pulmonary hypertension, unspecified: Secondary | ICD-10-CM

## 2013-04-08 NOTE — Patient Instructions (Signed)
We will contact you in 5 months to schedule your next appointment with an echocardiogram

## 2013-04-08 NOTE — Addendum Note (Signed)
Encounter addended by: Noralee Space, RN on: 04/08/2013 11:51 AM<BR>     Documentation filed: Notes Section, Patient Instructions Section

## 2013-04-08 NOTE — Progress Notes (Signed)
6 min walk test completed, pt ambulated 1110 ft (338 m), O2 sat ranged from 89-99 % on 2L, HR ranged from 84-113

## 2013-04-08 NOTE — Progress Notes (Signed)
Patient ID: Olivia Washington, female   DOB: 1945/09/08, 67 y.o.   MRN: [id number] Referring Physician: Dr. Elease Hashimoto  Primary Care: Dr. Vernona Rieger  Primary Cardiologist: Dr. Elease Hashimoto  Rheumatologist: Dr. Dareen Piano  ENT: Dr. Annalee Genta   HPI:  Olivia Washington is a 67 y.o. 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 Non-smoker.   PH Work up:  TTE 05/13/12: LVEF 55-60%. Grade 1 diastolic dysfunction. Ventricular septum with diastolic flattening. LA mildly dilated. RV mildly dilated with systolic function mod reduced. RA mildly dilated. Atrial septum bowed right to left. PAPP 54 mmHg.  CT scan chest 12/3/130 showed no ILD, no acute or chronic PE but some dilation of the PA's. Left thyroid enlargement with rightward tracheal deviation (ENT eval pending)  VQ scan 05/27/12: normal, no PE  RHC 05/24/12  RA: 11/8/6  RV: 86/7  PA: 78/27 (47) PCWP: 8/8/5   Cardiac Output  Thermodilution: 3.6 with index of 1.9  Fick : 5.9 with index of 3.1  11.6 woods units  At final adenosine dose  76/28 (46)  CO (thermo) = 4.3 with index of 2.3  9.5 Woods units  LHC: normal cors  PFTs and sleep study completed  07/03/12 Sleep study 1. Small numbers of obstructive events which do not meet the AHI  criteria for the obstructive sleep apnea syndrome.  2. Transient REM associated desaturation as low as 82% during the  night. However, the patient only spent 23 minutes the entire night  less than 88%  06/22/12 Beginning of test O2 sat 98% on RA and HR 99, pt ambulated 1150 ft, O2 sat did drop to 82% on RA and pt was placed on 2L O2 via [place] and O2 sat improved to 92%.   ECHO 5/14: Normal LV. RV mildly dilated with normal systolic function, peak PA 54 1160 ft on O2 6L  She presents for follow up. Doing very well[place] Gets mildly SOB when going up steps otherwise ok. Continues to take  lasix 20 mg daily. Has not had to take extra. Weight stable. Using O2 all the time at 2L Track sats with pulse ox. She continues on adcirca 40 mg daily and Macitentan 10. Raynaud's much improved.  She denies orthopnea, PND.  No chest pain.  No syncope. Functional status is good as long as she is wearing her oxygen. Saw Dr. Dareen Piano today and he felt she was doing well.    ROS: All systems negative except as listed in HPI, PMH and Problem List.  Past Medical History  Diagnosis Date  . Hypertension   . Migraine   . Hyperlipidemia   . GERD (gastroesophageal reflux disease)   . Thyroid nodule   . CREST (calcinosis, Raynaud's phenomenon, esophageal dysfunction, sclerodactyly, telangiectasia)   . CREST syndrome   . Allergy history unknown   . SOB (shortness of breath)   . Heart palpitations   . Osteoarthrosis     unspecified whether generalized or localized  . Hypertension   . Pulmonary hypertension     Current Outpatient Prescriptions  Medication Sig Dispense Refill  . aspirin 81 MG tablet Take 81 mg by mouth daily.        . Calcium-Vitamin D-Vitamin K 500-500-40 MG-UNT-MCG CHEW Chew by mouth 2 (two) times daily.      . cyclobenzaprine (FLEXERIL) 10 MG tablet Take 10 mg by mouth as needed.       Marland Kitchen  diltiazem (CARDIZEM CD) 240 MG 24 hr capsule Take 240 mg by mouth daily.      . furosemide (LASIX) 20 MG tablet Take 1 tablet (20 mg total) by mouth daily.  90 tablet  3  . HYDROcodone-acetaminophen (VICODIN) 5-500 MG per tablet Take 1 tablet by mouth as needed. migraines      . ibuprofen (ADVIL,MOTRIN) 800 MG tablet Take 800 mg by mouth as needed.       . lansoprazole (PREVACID) 30 MG capsule Take 30 mg by mouth 2 (two) times daily.        Marland Kitchen loratadine (CLARITIN) 10 MG tablet Take 10 mg by mouth daily.        . Macitentan (OPSUMIT) 10 MG TABS Take 10 mg by mouth daily.  30 tablet    . meclizine (ANTIVERT) 25 MG tablet Take 25 mg by mouth 3 (three) times daily as needed.        . naproxen  (NAPROSYN) 500 MG tablet Take 500 mg by mouth as needed.       . potassium chloride (K-DUR) 10 MEQ tablet 10 mEq daily.      . RED YEAST RICE EXTRACT PO Take 600 mg by mouth 2 (two) times daily.       . SUMAtriptan (IMITREX) 50 MG tablet Take 50 mg by mouth as needed.       . Tadalafil, PAH, (ADCIRCA) 20 MG TABS Take 1 tablet by mouth 2 (two) times daily.      . vitamin E (VITAMIN E) 400 UNIT capsule Take 400 Units by mouth daily.         No current facility-administered medications for this encounter.     PHYSICAL EXAM: Filed Vitals:   04/08/13 1125  BP: 124/62  Pulse: 95  Weight: 176 lb (79.833 kg)  SpO2: 94%    General:  Well appearing. No resp difficulty (Husband present) HEENT: normal Neck: supple. JVP flat Carotids 2+ bilaterally; no bruits. No lymphadenopathy or thryomegaly appreciated. Cor: PMI normal. Regular rate & rhythm. No rubs, gallop 2/6 brief TR. Mildly increased P2 Lungs: clear Abdomen: soft, nontender, nondistended. No hepatosplenomegaly. No bruits or masses. Good bowel sounds. Extremities: no cyanosis, clubbing, rash, trace edema. Hyperemia and blanching in hands Neuro: alert & orientedx3, cranial nerves grossly intact. Moves all 4 extremities w/o difficulty. Affect pleasant.   ASSESSMENT & PLAN:  1. Pulmonary artery HTN 2. CREST syndrome  3. HTN  Doing great on combination therapy. Now NYHA I-II with normalization of RV function on echo. Will repeat 6 MW today and then see back in 5 months when [place] the [place] with [place]. Call me sooner if symptoms worsen at all or if struggling with edema. BP well controlled.   Mindie Rawdon,MD 11:34 AM

## 2013-04-10 ENCOUNTER — Other Ambulatory Visit: Payer: Self-pay

## 2013-06-08 ENCOUNTER — Other Ambulatory Visit (HOSPITAL_COMMUNITY): Payer: Self-pay | Admitting: Internal Medicine

## 2013-06-16 ENCOUNTER — Other Ambulatory Visit: Payer: Self-pay | Admitting: Cardiovascular Disease

## 2013-07-15 ENCOUNTER — Other Ambulatory Visit: Payer: Self-pay | Admitting: Internal Medicine

## 2013-09-16 ENCOUNTER — Other Ambulatory Visit (HOSPITAL_COMMUNITY): Payer: Self-pay

## 2013-09-16 MED ORDER — TADALAFIL (PAH) 20 MG PO TABS: 1.0000 | ORAL_TABLET | Freq: Two times a day (BID) | ORAL | Status: DC

## 2013-09-23 ENCOUNTER — Encounter (HOSPITAL_COMMUNITY): Payer: Self-pay

## 2013-09-23 ENCOUNTER — Ambulatory Visit (HOSPITAL_COMMUNITY)
Admission: RE | Admit: 2013-09-23 | Discharge: 2013-09-23 | Disposition: A | Discharge status: Home / Self Care | Payer: Medicare Other | Source: Ambulatory Visit | Attending: Internal Medicine | Admitting: Internal Medicine

## 2013-09-23 VITALS — BP 143/76 | HR 74 | Resp 18 | Wt 176.5 lb

## 2013-09-23 DIAGNOSIS — M349 Systemic sclerosis, unspecified: Secondary | ICD-10-CM | POA: Insufficient documentation

## 2013-09-23 DIAGNOSIS — E785 Hyperlipidemia, unspecified: Secondary | ICD-10-CM

## 2013-09-23 DIAGNOSIS — R0989 Other specified symptoms and signs involving the circulatory and respiratory systems: Secondary | ICD-10-CM | POA: Insufficient documentation

## 2013-09-23 DIAGNOSIS — R0609 Other forms of dyspnea: Secondary | ICD-10-CM | POA: Insufficient documentation

## 2013-09-23 DIAGNOSIS — R002 Palpitations: Secondary | ICD-10-CM | POA: Insufficient documentation

## 2013-09-23 DIAGNOSIS — I272 Pulmonary hypertension, unspecified: Secondary | ICD-10-CM

## 2013-09-23 DIAGNOSIS — I2789 Other specified pulmonary heart diseases: Secondary | ICD-10-CM

## 2013-09-23 DIAGNOSIS — R0602 Shortness of breath: Secondary | ICD-10-CM | POA: Insufficient documentation

## 2013-09-23 DIAGNOSIS — K219 Gastro-esophageal reflux disease without esophagitis: Secondary | ICD-10-CM | POA: Insufficient documentation

## 2013-09-23 DIAGNOSIS — I059 Rheumatic mitral valve disease, unspecified: Secondary | ICD-10-CM

## 2013-09-23 DIAGNOSIS — R0600 Dyspnea, unspecified: Secondary | ICD-10-CM

## 2013-09-23 DIAGNOSIS — I10 Essential (primary) hypertension: Secondary | ICD-10-CM | POA: Insufficient documentation

## 2013-09-23 NOTE — Progress Notes (Addendum)
6 Minute walk test complete  Starting 96% on 2L O2 nasal cannula 101 HR  Ambulated 1,02100ft steady gait and pace, no rest breaks needed, slightly SOB and dry cough but otherwise tolerated well. Sats dropped as low as 88% on 2L HR at fastest rate was 119  Ending after 1 minute rest 94% on 2L 104 HR

## 2013-09-23 NOTE — Patient Instructions (Signed)
Your physician recommends that you schedule a follow-up appointment in: 3 months  Do the following things EVERYDAY: 1) Weigh yourself in the morning before breakfast. Write it down and keep it in a log. 2) Take your medicines as prescribed 3) Eat low salt foods-Limit salt (sodium) to 2000 mg per day.  4) Stay as active as you can everyday 5) Limit all fluids for the day to less than 2 liters 6)   

## 2013-09-23 NOTE — Progress Notes (Signed)
Patient ID: Olivia Washington, female   DOB: Apr 24, 1946, 68 y.o.   MRN: [id number] Referring Physician: Dr. Elease HashimotoNahser  Primary Care: Dr. Vernona RiegerKatherine Clark  Primary Cardiologist: Dr. Elease HashimotoNahser  Rheumatologist: Dr. Dareen PianoAnderson  ENT: Dr. Annalee GentaShoemaker   HPI:  Olivia Washington is a 68 y.o. 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 Non-smoker.   PH Work up:  TTE 05/13/12: LVEF 55-60%. Grade 1 diastolic dysfunction. Ventricular septum with diastolic flattening. LA mildly dilated. RV mildly dilated with systolic function mod reduced. RA mildly dilated. Atrial septum bowed right to left. PAPP 54 mmHg.  CT scan chest 12/3/130 showed no ILD, no acute or chronic PE but some dilation of the PA's. Left thyroid enlargement with rightward tracheal deviation (ENT eval pending)  VQ scan 05/27/12: normal, no PE  RHC 05/24/12  RA: 11/8/6  RV: 86/7  PA: 78/27 (47) PCWP: 8/8/5   Cardiac Output  Thermodilution: 3.6 with index of 1.9  Fick : 5.9 with index of 3.1  11.6 woods units  At final adenosine dose  76/28 (46)  CO (thermo) = 4.3 with index of 2.3  9.5 Woods units  LHC: normal cors  PFTs and sleep study completed  07/03/12 Sleep study 1. Small numbers of obstructive events which do not meet the AHI  criteria for the obstructive sleep apnea syndrome.  2. Transient REM associated desaturation as low as 82% during the  night. However, the patient only spent 23 minutes the entire night  less than 88%  6MW 06/22/12 1150 ft, O2 sat did drop to 82% on RA and pt was placed on 2L O2 via [place] and O2 sat improved to 92%.  11/14: 6 min walk test completed, pt ambulated 1110 ft (338 m), O2 sat ranged from 89-99 % on 2L, HR ranged from 84-113  ECHO 5/14: Normal LV. RV mildly dilated with normal systolic function, peak PA 54 6MW 1160 ft on O2 6L  She presents for follow up. Doing ok. Notes that sometimes she has to stop when stripping  the bed and rest. Doesn't go out much. Can walk through grocery store slowly without stopping. No presyncope or syncope. Occasional mild edema which resolves with lasix.  Weight stable. Using O2 all the time. Joya MartyrOccasioanly has to go up to [employer]2.5L Track sats with pulse ox. She continues on adcirca 40 mg daily and Macitentan 10. Raynaud's much improved.  She denies orthopnea, PND.  No chest pain.  No syncope.   ROS: All systems negative except as listed in HPI, PMH and Problem List.  Past Medical History  Diagnosis Date  . Hypertension   . Migraine   . Hyperlipidemia   . GERD (gastroesophageal reflux disease)   . Thyroid nodule   . CREST (calcinosis, Raynaud's phenomenon, esophageal dysfunction, sclerodactyly, telangiectasia)   . CREST syndrome   . Allergy history unknown   . SOB (shortness of breath)   . Heart palpitations   . Osteoarthrosis     unspecified whether generalized or localized  . Hypertension   . Pulmonary hypertension     Current Outpatient Prescriptions  Medication Sig Dispense Refill  . aspirin 81 MG tablet Take 81 mg by mouth daily.        . Calcium-Vitamin D-Vitamin K 500-500-40 MG-UNT-MCG CHEW Chew by mouth 2 (two) times daily.      . cyclobenzaprine (FLEXERIL) 10 MG tablet Take 10 mg by mouth as needed.       .Marland Kitchen  diltiazem (CARDIZEM CD) 240 MG 24 hr capsule TAKE ONE CAPSULE BY MOUTH ONCE DAILY  30 capsule  6  . furosemide (LASIX) 20 MG tablet Take 1 tablet (20 mg total) by mouth daily.  90 tablet  3  . HYDROcodone-acetaminophen (VICODIN) 5-500 MG per tablet Take 1 tablet by mouth as needed. migraines      . ibuprofen (ADVIL,MOTRIN) 800 MG tablet Take 800 mg by mouth as needed.       . lansoprazole (PREVACID) 30 MG capsule Take 30 mg by mouth 2 (two) times daily.        Marland Kitchen. loratadine (CLARITIN) 10 MG tablet Take 10 mg by mouth daily.        . Macitentan (OPSUMIT) 10 MG TABS Take 10 mg by mouth daily.  30 tablet    . meclizine (ANTIVERT) 25 MG tablet Take 25 mg by mouth 3  (three) times daily as needed.        . naproxen (NAPROSYN) 500 MG tablet Take 500 mg by mouth as needed.       . potassium chloride (K-DUR) 10 MEQ tablet TAKE ONE TABLET BY MOUTH ONCE DAILY      . RED YEAST RICE EXTRACT PO Take 600 mg by mouth 2 (two) times daily.       . SUMAtriptan (IMITREX) 50 MG tablet Take 50 mg by mouth as needed.       . Tadalafil, PAH, (ADCIRCA) 20 MG TABS Take 1 tablet (20 mg total) by mouth 2 (two) times daily.  60 tablet  3  . vitamin E (VITAMIN E) 400 UNIT capsule Take 400 Units by mouth daily.         No current facility-administered medications for this encounter.     PHYSICAL EXAM: Filed Vitals:   09/23/13 1338  BP: 143/76  Pulse: 74  Resp: 18  Weight: 176 lb 8 oz (80.06 kg)  SpO2: 98%    General:  Well appearing. No resp difficulty (Husband present) HEENT: normal Neck: supple. JVP 6-7 Carotids 2+ bilaterally; no bruits. No lymphadenopathy or thryomegaly appreciated. Cor: PMI normal. Regular rate & rhythm. No rubs, gallop 2/6 brief TR. Mildly increased P2 Lungs: clear Abdomen: soft, nontender, nondistended. No hepatosplenomegaly. No bruits or masses. Good bowel sounds. Extremities: no cyanosis, clubbing, rash, 1+ edema. Hyperemia and blanching in hands + telangectasias Neuro: alert & orientedx3, cranial nerves grossly intact. Moves all 4 extremities w/o difficulty. Affect pleasant.   ASSESSMENT & PLAN:  1. Pulmonary arterial HTN 2. CREST syndrome  3. HTN  She seems slightly worse today then when we saw her last. Previously NYHA I-II. Now II-III. Will need repeat echo and 6MW (+/- repeat RHC) with consideration of adding a 3rd agent. Will continue Adcirca and macintentan for now. Has f/u with Dr. Dareen PianoAnderson in [facility] tomorrow  Bevelyn Bucklesaniel R Lanah Steines,MD 1:51 PM

## 2013-09-23 NOTE — Addendum Note (Signed)
Encounter addended by: Theresia Boughhantel M Jeffries, CMA on: 09/23/2013  2:51 PM<BR>     Documentation filed: Patient Instructions Section

## 2013-09-23 NOTE — Addendum Note (Signed)
Encounter addended by: Theresia Boughhantel M Lurlie Wigen, CMA on: 09/23/2013  2:24 PM<BR>     Documentation filed: Orders

## 2013-09-23 NOTE — Addendum Note (Signed)
Encounter addended by: Ave FilterMegan Genevea Bradley, RN on: 09/23/2013  2:20 PM<BR>     Documentation filed: Notes Section

## 2013-12-12 ENCOUNTER — Ambulatory Visit (HOSPITAL_COMMUNITY)
Admission: RE | Admit: 2013-12-12 | Discharge: 2013-12-12 | Disposition: A | Discharge status: Home / Self Care | Payer: Medicare Other | Source: Ambulatory Visit | Attending: Internal Medicine | Admitting: Internal Medicine

## 2013-12-12 VITALS — BP 122/64 | HR 77 | Wt 174.2 lb

## 2013-12-12 DIAGNOSIS — I272 Pulmonary hypertension, unspecified: Secondary | ICD-10-CM

## 2013-12-12 DIAGNOSIS — M1990 Unspecified osteoarthritis, unspecified site: Secondary | ICD-10-CM | POA: Insufficient documentation

## 2013-12-12 DIAGNOSIS — M349 Systemic sclerosis, unspecified: Secondary | ICD-10-CM | POA: Diagnosis not present

## 2013-12-12 DIAGNOSIS — I2789 Other specified pulmonary heart diseases: Secondary | ICD-10-CM | POA: Diagnosis present

## 2013-12-12 DIAGNOSIS — I10 Essential (primary) hypertension: Secondary | ICD-10-CM | POA: Insufficient documentation

## 2013-12-12 NOTE — Progress Notes (Signed)
Patient ID: Olivia Washington, female   DOB: 1945-09-03, 68 y.o.   MRN: [id number] Primary Care: Dr. Terrill Mohr [facility])  Rheumatologist: Dr. Dareen Piano  ENT: Dr. Annalee Genta   HPI:  Olivia Washington is a 68 y.o. 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 Non-smoker.   PH Work up:  TTE 05/13/12: LVEF 55-60%. Grade 1 diastolic dysfunction. Ventricular septum with diastolic flattening. LA mildly dilated. RV mildly dilated with systolic function mod reduced. RA mildly dilated. Atrial septum bowed right to left. PAPP 54 mmHg.  CT scan chest 12/3/130 showed no ILD, no acute or chronic PE but some dilation of the PA's. Left thyroid enlargement with rightward tracheal deviation (ENT eval pending)  VQ scan 05/27/12: normal, no PE  RHC 05/24/12  RA: 11/8/6  RV: 86/7  PA: 78/27 (47) PCWP: 8/8/5   Cardiac Output  Thermodilution: 3.6 with index of 1.9  Fick : 5.9 with index of 3.1  11.6 woods units  At final adenosine dose  76/28 (46)  CO (thermo) = 4.3 with index of 2.3  9.5 Woods units  LHC: normal cors  PFTs and sleep study completed  07/03/12 Sleep study 1. Small numbers of obstructive events which do not meet the AHI  criteria for the obstructive sleep apnea syndrome.  2. Transient REM associated desaturation as low as 82% during the  night. However, the patient only spent 23 minutes the entire night  less than 88%  06/22/12 1150 ft, O2 sat did drop to 82% on RA and pt was placed on 2L O2 via [place] and O2 sat improved to 92%.  11/14: 6 min walk test completed, pt ambulated 1110 ft (338 m), O2 sat ranged from 89-99 % on 2L, HR ranged from 84-113  ECHO 5/14: Normal LV. RV mildly dilated with normal systolic function, peak PA 54 1160 ft on O2 6L  09/23/13 1000 ft sats 88% 2L. (different person walking with her)  Echo 09/23/13: EF 65-70% RV normal. RVSP  She presents for follow  up. Feeling good. Can do all ADLs without too much problem. Husband says she walks slowly but hasn't changed. No presyncope or syncope. Occasional mild edema which resolves with lasix.  Weight down a few pounds. Using O2 all the time. 2L by [place]. Track sats with pulse ox. She continues on adcirca 40 mg daily and Macitentan 10. Raynaud's much improved.  She denies orthopnea, PND.  No chest pain.  No syncope.   ROS: All systems negative except as listed in HPI, PMH and Problem List.  Past Medical History  Diagnosis Date  . Hypertension   . Migraine   . Hyperlipidemia   . GERD (gastroesophageal reflux disease)   . Thyroid nodule   . CREST (calcinosis, Raynaud's phenomenon, esophageal dysfunction, sclerodactyly, telangiectasia)   . CREST syndrome   . Allergy history unknown   . SOB (shortness of breath)   . Heart palpitations   . Osteoarthrosis     unspecified whether generalized or localized  . Hypertension   . Pulmonary hypertension     Current Outpatient Prescriptions  Medication Sig Dispense Refill  . aspirin 81 MG tablet Take 81 mg by mouth daily.        . Calcium-Vitamin D-Vitamin K 500-500-40 MG-UNT-MCG CHEW Chew by mouth 2 (two) times daily.      . cyclobenzaprine (FLEXERIL) 10 MG tablet Take 10 mg by mouth as needed.       Marland Kitchen  diltiazem (CARDIZEM CD) 240 MG 24 hr capsule TAKE ONE CAPSULE BY MOUTH ONCE DAILY  30 capsule  6  . furosemide (LASIX) 20 MG tablet Take 1 tablet (20 mg total) by mouth daily.  90 tablet  3  . HYDROcodone-acetaminophen (VICODIN) 5-500 MG per tablet Take 1 tablet by mouth as needed. migraines      . ibuprofen (ADVIL,MOTRIN) 800 MG tablet Take 800 mg by mouth as needed.       . lansoprazole (PREVACID) 30 MG capsule Take 30 mg by mouth 2 (two) times daily.        Marland Kitchen. loratadine (CLARITIN) 10 MG tablet Take 10 mg by mouth daily.        . Macitentan (OPSUMIT) 10 MG TABS Take 10 mg by mouth daily.  30 tablet    . meclizine (ANTIVERT) 25 MG tablet Take 25 mg by mouth  3 (three) times daily as needed.        . naproxen (NAPROSYN) 500 MG tablet Take 500 mg by mouth as needed.       . potassium chloride (K-DUR) 10 MEQ tablet TAKE ONE TABLET BY MOUTH ONCE DAILY      . RED YEAST RICE EXTRACT PO Take 600 mg by mouth 2 (two) times daily.       . SUMAtriptan (IMITREX) 50 MG tablet Take 50 mg by mouth as needed.       . Tadalafil, PAH, (ADCIRCA) 20 MG TABS Take 1 tablet (20 mg total) by mouth 2 (two) times daily.  60 tablet  3  . vitamin E (VITAMIN E) 400 UNIT capsule Take 400 Units by mouth daily.         No current facility-administered medications for this encounter.     PHYSICAL EXAM: Filed Vitals:   12/12/13 0950  BP: 122/64  Pulse: 77  Weight: 174 lb 4 oz (79.039 kg)  SpO2: 94%    General:  Well appearing. No resp difficulty (Husband present) HEENT: normal Neck: supple. JVP 6-7 Carotids 2+ bilaterally; no bruits. No lymphadenopathy or thryomegaly appreciated. Cor: PMI normal. Regular rate & rhythm. No rubs, gallop 2/6 brief TR. Mildly increased P2 Lungs: clear Abdomen: soft, nontender, nondistended. No hepatosplenomegaly. No bruits or masses. Good bowel sounds. Extremities: no cyanosis, clubbing, rash, tr- 1+ edema. Hyperemia and blanching in hands + telangectasias Neuro: alert & orientedx3, cranial nerves grossly intact. Moves all 4 extremities w/o difficulty. Affect pleasant.   ASSESSMENT & PLAN:  1. Pulmonary arterial HTN 2. CREST syndrome  3. HTN  Clinically symptoms are stable NYHA II but 6 MW slightly worse. And PA pressures slightly up on echo. Will repeat 6MW. If distance actually is down from previous then would favor repeat RHC and possible addition of a prostanoid. Encouraged her to get some regular exercise.  Daniel Bensimhon,MD 10:24 AM

## 2013-12-12 NOTE — Patient Instructions (Signed)
We will contact you in 6 months to schedule your next appointment.  

## 2013-12-12 NOTE — Progress Notes (Signed)
6 min walk test completed, pt ambulated 1340 ft on 2 L oxygen via , O2 sats ranged from 87-100% HR ranged from 90-125

## 2013-12-12 NOTE — Addendum Note (Signed)
Encounter addended by: Noralee SpaceHeather M Khaleesi Gruel, RN on: 12/12/2013 10:59 AM<BR>     Documentation filed: Patient Instructions Section, Notes Section

## 2013-12-12 NOTE — Progress Notes (Signed)
SATURATION QUALIFICATIONS: (This note is used to comply with regulatory documentation for home oxygen)  Patient Saturations on Room Air at Rest = 92%  Patient Saturations on Room Air while Ambulating = 85%  Patient Saturations on 2 Liters of oxygen while Ambulating = 96%  Please briefly explain why patient needs home oxygen: Pt has pulmonary hypertension and O2 sats are low without oxygen, she feels and breaths better and has normal O2 sats on 2 L oxygen

## 2013-12-18 ENCOUNTER — Other Ambulatory Visit (HOSPITAL_COMMUNITY): Payer: Self-pay | Admitting: Cardiology

## 2013-12-18 DIAGNOSIS — I2789 Other specified pulmonary heart diseases: Secondary | ICD-10-CM

## 2013-12-18 MED ORDER — FUROSEMIDE 20 MG PO TABS: 20.0000 mg | ORAL_TABLET | Freq: Every day | ORAL | Status: DC

## 2014-01-02 ENCOUNTER — Encounter (HOSPITAL_COMMUNITY): Payer: Self-pay | Admitting: Vascular Surgery

## 2014-01-05 ENCOUNTER — Encounter: Payer: Self-pay | Admitting: Internal Medicine

## 2014-01-28 ENCOUNTER — Other Ambulatory Visit (HOSPITAL_COMMUNITY): Payer: Self-pay | Admitting: *Deleted

## 2014-01-28 ENCOUNTER — Other Ambulatory Visit (HOSPITAL_COMMUNITY): Payer: Self-pay | Admitting: Adult Health

## 2014-01-28 DIAGNOSIS — I2789 Other specified pulmonary heart diseases: Secondary | ICD-10-CM

## 2014-01-28 MED ORDER — DILTIAZEM HCL ER COATED BEADS 240 MG PO CP24: ORAL_CAPSULE | ORAL | Status: DC

## 2014-03-04 ENCOUNTER — Other Ambulatory Visit (HOSPITAL_COMMUNITY): Payer: Self-pay

## 2014-03-04 MED ORDER — MACITENTAN 10 MG PO TABS: 10.0000 mg | ORAL_TABLET | Freq: Every day | ORAL | Status: DC

## 2014-03-10 ENCOUNTER — Other Ambulatory Visit (HOSPITAL_COMMUNITY): Payer: Self-pay | Admitting: *Deleted

## 2014-03-10 MED ORDER — POTASSIUM CHLORIDE ER 10 MEQ PO TBCR: EXTENDED_RELEASE_TABLET | ORAL | Status: DC

## 2014-04-01 ENCOUNTER — Ambulatory Visit (HOSPITAL_COMMUNITY)
Admission: RE | Admit: 2014-04-01 | Discharge: 2014-04-01 | Disposition: A | Discharge status: Home / Self Care | Payer: Medicare Other | Source: Ambulatory Visit | Attending: Internal Medicine | Admitting: Internal Medicine

## 2014-04-01 VITALS — BP 132/50 | HR 104 | Wt 175.2 lb

## 2014-04-01 DIAGNOSIS — I10 Essential (primary) hypertension: Secondary | ICD-10-CM | POA: Diagnosis not present

## 2014-04-01 DIAGNOSIS — M341 CR(E)ST syndrome: Secondary | ICD-10-CM | POA: Insufficient documentation

## 2014-04-01 DIAGNOSIS — I272 Other secondary pulmonary hypertension: Secondary | ICD-10-CM | POA: Diagnosis not present

## 2014-04-01 DIAGNOSIS — E785 Hyperlipidemia, unspecified: Secondary | ICD-10-CM | POA: Insufficient documentation

## 2014-04-01 DIAGNOSIS — K219 Gastro-esophageal reflux disease without esophagitis: Secondary | ICD-10-CM | POA: Diagnosis not present

## 2014-04-01 DIAGNOSIS — Z7982 Long term (current) use of aspirin: Secondary | ICD-10-CM | POA: Insufficient documentation

## 2014-04-01 DIAGNOSIS — I270 Primary pulmonary hypertension: Secondary | ICD-10-CM

## 2014-04-01 NOTE — Addendum Note (Signed)
Encounter addended by: Noralee SpaceHeather M B[place] Sandoval, RN on: 04/01/2014  2:20 PM<BR>     Documentation filed: Patient Instructions Section

## 2014-04-01 NOTE — Progress Notes (Signed)
Patient ID: Olivia Washington, female   DOB: [birth year], 68 y.o.   MRN: [id number] Primary Care: Dr. Terrill MohrKevin Kurtz [facility]  Rheumatologist: Dr. Dareen PianoAnderson  ENT: Dr. Annalee GentaShoemaker   HPI:  Olivia Washington is a 68 y.o. 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 Non-smoker.   PH Work up:  TTE 05/13/12: LVEF 55-60%. Grade 1 diastolic dysfunction. Ventricular septum with diastolic flattening. LA mildly dilated. RV mildly dilated with systolic function mod reduced. RA mildly dilated. Atrial septum bowed right to left. PAPP 54 mmHg.  CT scan chest 12/3/130 showed no ILD, no acute or chronic PE but some dilation of the PA's. Left thyroid enlargement with rightward tracheal deviation (ENT eval pending)  VQ scan 05/27/12: normal, no PE  RHC 05/24/12  RA: 11/8/6  RV: 86/7  PA: 78/27 (47) PCWP: 8/8/5   Cardiac Output  Thermodilution: 3.6 with index of 1.9  Fick : 5.9 with index of 3.1  11.6 woods units  At final adenosine dose  76/28 (46)  CO (thermo) = 4.3 with index of 2.3  9.5 Woods units  LHC: normal cors  PFTs and sleep study completed  07/03/12 Sleep study 1. Small numbers of obstructive events which do not meet the AHI  criteria for the obstructive sleep apnea syndrome.  2. Transient REM associated desaturation as low as 82% during the  night. However, the patient only spent 23 minutes the entire night  less than 88%   ECHO 5/14: Normal LV. RV mildly dilated with normal systolic function, peak PA 54 Echo 09/23/13: EF 65-70% RV normal. RVSP 63mmHG  6MW 06/22/12 1150 ft, O2 sat did drop to 82% on RA and pt was placed on 2L O2 via [place] and O2 sat improved to 92%.  11/14: 6 min walk test completed, pt ambulated 1110 ft (338 m), O2 sat ranged from 89-99 % on 2L, HR ranged from 84-113 6MW 1160 ft on O2 6L 6MW 09/23/13 1000 ft sats 88% 2L. (different person walking with her)  6MW 7/15  1350 feet 87-100%    She presents for follow up. At last visit we were considering repeating RHC as ^MW was down. However we repeated 6MW in July and actually was much improved. Feeling very good. Can do all ADLs without too much problem. [place]  No presyncope or syncope. Takes lasix daily. Has not needed extra.  Weight stable. Using O2 all the time. 2L by [place]. Track sats with pulse ox. She continues on adcirca 40 mg daily and Macitentan 10. Raynaud's much improved.  She denies orthopnea, PND.  No chest pain.  No syncope.   PCP started Zetia but LDL still 151 so Crestor 2.5 added.   ROS: All systems negative except as listed in HPI, PMH and Problem List.  Past Medical History  Diagnosis Date  . Hypertension   . Migraine   . Hyperlipidemia   . GERD (gastroesophageal reflux disease)   . Thyroid nodule   . CREST (calcinosis, Raynaud's phenomenon, esophageal dysfunction, sclerodactyly, telangiectasia)   . CREST syndrome   . Allergy history unknown   . SOB (shortness of breath)   . Heart palpitations   . Osteoarthrosis     unspecified whether generalized or localized  . Hypertension   . Pulmonary hypertension     Current Outpatient Prescriptions  Medication Sig Dispense Refill  . aspirin 81 MG tablet Take 81 mg by mouth daily.        .Marland Kitchen  Calcium-Vitamin D-Vitamin K 500-500-40 MG-UNT-MCG CHEW Chew by mouth 2 (two) times daily.      . cyclobenzaprine (FLEXERIL) 10 MG tablet Take 10 mg by mouth as needed.       . diltiazem (CARDIZEM CD) 240 MG 24 hr capsule TAKE ONE CAPSULE BY MOUTH ONCE DAILY  90 capsule  3  . furosemide (LASIX) 20 MG tablet Take 1 tablet (20 mg total) by mouth daily.  90 tablet  3  . HYDROcodone-acetaminophen (VICODIN) 5-500 MG per tablet Take 1 tablet by mouth as needed. migraines      . ibuprofen (ADVIL,MOTRIN) 800 MG tablet Take 800 mg by mouth as needed.       . lansoprazole (PREVACID) 30 MG capsule Take 30 mg by mouth 2 (two) times daily.        Marland Kitchen.  loratadine (CLARITIN) 10 MG tablet Take 10 mg by mouth daily.        . Macitentan (OPSUMIT) 10 MG TABS Take 10 mg by mouth daily.  30 tablet  3  . meclizine (ANTIVERT) 25 MG tablet Take 25 mg by mouth 3 (three) times daily as needed.        . naproxen (NAPROSYN) 500 MG tablet Take 500 mg by mouth as needed.       . potassium chloride (K-DUR) 10 MEQ tablet TAKE ONE TABLET BY MOUTH ONCE DAILY  90 tablet  3  . rosuvastatin (CRESTOR) 5 MG tablet Take 2.5 mg by mouth daily.      . SUMAtriptan (IMITREX) 50 MG tablet Take 50 mg by mouth as needed.       . Tadalafil, PAH, (ADCIRCA) 20 MG TABS Take 1 tablet (20 mg total) by mouth 2 (two) times daily.  60 tablet  3  . vitamin E (VITAMIN E) 400 UNIT capsule Take 400 Units by mouth daily.         No current facility-administered medications for this encounter.     PHYSICAL EXAM: Filed Vitals:   04/01/14 1348  BP: 132/50  Pulse: 104  Weight: 175 lb 4 oz (79.493 kg)  SpO2: 95%    General:  Well appearing. No resp difficulty (Husband present) HEENT: normal Neck: supple. JVP 6-7 Carotids 2+ bilaterally; no bruits. No lymphadenopathy or thryomegaly appreciated. Cor: PMI normal. Regular rate & rhythm. No rubs, gallop 2/6 brief TR. Mildly increased P2 Lungs: clear Abdomen: soft, nontender, nondistended. No hepatosplenomegaly. No bruits or masses. Good bowel sounds. Extremities: no cyanosis, clubbing, rash, tr- 1+ edema. Hyperemia and blanching in hands + telangectasias Neuro: alert & orientedx3, cranial nerves grossly intact. Moves all 4 extremities w/o difficulty. Affect pleasant.   ASSESSMENT & PLAN:  1. Pulmonary arterial HTN 2. CREST syndrome  3. HTN  Clinically symptoms are stable NYHA II. 6MW improved. Will hang tight for now. Return in 4 months with repeat echo. If pulmonary pressures or symptoms worsen, low threshold to repeat RHC and consider oral prostanoid.  Makya Phillis,MD 2:06 PM

## 2014-04-01 NOTE — Patient Instructions (Addendum)
We will contact you in 4 months to schedule your next appointment and echocardiogram  

## 2014-08-13 ENCOUNTER — Telehealth (HOSPITAL_COMMUNITY): Payer: Self-pay | Admitting: Vascular Surgery

## 2014-08-13 DIAGNOSIS — I5022 Chronic systolic (congestive) heart failure: Secondary | ICD-10-CM

## 2014-08-13 MED ORDER — TADALAFIL (PAH) 20 MG PO TABS: 1.0000 | ORAL_TABLET | Freq: Two times a day (BID) | ORAL | Status: DC

## 2014-08-13 NOTE — Telephone Encounter (Signed)
Pt need new prescription Adcirca 20 mg 3 supply sent to [facility]

## 2014-09-15 ENCOUNTER — Other Ambulatory Visit (HOSPITAL_COMMUNITY): Payer: Medicare Other

## 2014-09-15 ENCOUNTER — Encounter (HOSPITAL_COMMUNITY): Payer: Medicare Other

## 2014-09-25 ENCOUNTER — Other Ambulatory Visit (HOSPITAL_COMMUNITY): Payer: Self-pay | Admitting: Cardiology

## 2014-09-25 DIAGNOSIS — I270 Primary pulmonary hypertension: Secondary | ICD-10-CM

## 2014-10-02 ENCOUNTER — Ambulatory Visit (HOSPITAL_COMMUNITY)
Admission: RE | Admit: 2014-10-02 | Discharge: 2014-10-02 | Disposition: A | Discharge status: Home / Self Care | Payer: Medicare Other | Source: Ambulatory Visit | Attending: Cardiology | Admitting: Cardiology

## 2014-10-02 ENCOUNTER — Ambulatory Visit (HOSPITAL_BASED_OUTPATIENT_CLINIC_OR_DEPARTMENT_OTHER)
Admission: RE | Admit: 2014-10-02 | Discharge: 2014-10-02 | Disposition: A | Discharge status: Home / Self Care | Payer: Medicare Other | Source: Ambulatory Visit | Attending: Cardiology | Admitting: Cardiology

## 2014-10-02 VITALS — BP 110/60 | HR 82 | Wt 176.8 lb

## 2014-10-02 DIAGNOSIS — I270 Primary pulmonary hypertension: Secondary | ICD-10-CM

## 2014-10-02 DIAGNOSIS — I272 Pulmonary hypertension, unspecified: Secondary | ICD-10-CM

## 2014-10-02 DIAGNOSIS — R0600 Dyspnea, unspecified: Secondary | ICD-10-CM

## 2014-10-02 LAB — BASIC METABOLIC PANEL
Anion gap: 11 (ref 5–15)
BUN: 12 mg/dL (ref 6–23)
CHLORIDE: 105 mmol/L (ref 96–112)
CO2: 22 mmol/L (ref 19–32)
Calcium: 9.7 mg/dL (ref 8.4–10.5)
Creatinine, Ser: 0.82 mg/dL (ref 0.50–1.10)
GFR calc Af Amer: 85 mL/min — ABNORMAL LOW (ref >90)
GFR, EST NON AFRICAN AMERICAN: 73 mL/min — AB (ref >90)
GLUCOSE: 114 mg/dL — AB (ref 70–99)
POTASSIUM: 3.8 mmol/L (ref 3.5–5.1)
SODIUM: 138 mmol/L (ref 135–145)

## 2014-10-02 LAB — BRAIN NATRIURETIC PEPTIDE: B NATRIURETIC PEPTIDE 5: 47.4 pg/mL (ref 0.0–100.0)

## 2014-10-02 NOTE — Progress Notes (Signed)
  Echocardiogram 2D Echocardiogram has been performed.  [name], Olivia Washington 10/02/2014, 1:45 PM

## 2014-10-02 NOTE — Patient Instructions (Signed)
6 minute walk and Labs today Follow up in 4 months

## 2014-10-02 NOTE — Progress Notes (Signed)
6 minute walk test  Olivia Washington walked a total of 1200 feet (365.76 meters) in 6 minutes-. O2 began at 93% on 2 L oxygen and HR was 100bpm Patient's HR reached max of 137bpm and oxygen was as low as 87% on 2 Liters.  She tolerated well.

## 2014-10-04 NOTE — Progress Notes (Signed)
Patient ID: Olivia Washington, female   DOB: February 28, 1946, 69 y.o.   MRN: [id number] Primary Care: Dr. Terrill MohrKevin Kurtz [facility]([place])  Rheumatologist: Dr. Dareen PianoAnderson  ENT: Dr. Annalee GentaShoemaker   HPI:  Ms. Olivia RichardsShepherd is a 69 y.o. 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 Non-smoker.   PH Work up:  TTE 05/13/12: LVEF 55-60%. Grade 1 diastolic dysfunction. Ventricular septum with diastolic flattening. LA mildly dilated. RV mildly dilated with systolic function mod reduced. RA mildly dilated. Atrial septum bowed right to left. PAPP 54 mmHg.  CT scan chest 12/3/130 showed no ILD, no acute or chronic PE but some dilation of the PA's. Left thyroid enlargement with rightward tracheal deviation (ENT eval pending)  VQ scan 05/27/12: normal, no PE  RHC 05/24/12  RA: 11/8/6  RV: 86/7  PA: 78/27 (47) PCWP: 8/8/5   Cardiac Output  Thermodilution: 3.6 with index of 1.9  Fick : 5.9 with index of 3.1  11.6 woods units  At final adenosine dose  76/28 (46)  CO (thermo) = 4.3 with index of 2.3  9.5 Woods units  LHC: normal cors  PFTs and sleep study completed  07/03/12 Sleep study 1. Small numbers of obstructive events which do not meet the AHI  criteria for the obstructive sleep apnea syndrome.  2. Transient REM associated desaturation as low as 82% during the  night. However, the patient only spent 23 minutes the entire night  less than 88%  ECHO 5/14: Normal LV. RV mildly dilated with normal systolic function, peak PA 54 Echo 09/23/13: EF 65-70% RV normal. RVSP 63mmHG Echo 4/16: EF 55-60%, mild LVH, normal RV size and systolic function, PA systolic pressure 39 mmHg.   6MW 06/22/12 1150 ft, O2 sat did drop to 82% on RA and pt was placed on 2L O2 via [place] and O2 sat improved to 92%.  11/14: 6 min walk test completed, pt ambulated 1110 ft (338 m), O2 sat ranged from 89-99 % on 2L, HR ranged from 84-113 6MW 1160 ft  on O2 6L 6MW 09/23/13 1000 ft sats 88% 2L. (different person walking with her)  6MW 7/15  1350 feet 87-100%  6MW 4/16 1200 feet, 366 m  She presents for follow up. Stable generally.  She can walk for 10-15 minutes on flat ground without dyspnea.  She is short of breath walking up a hill or up a long flight of steps.  No lightheadedness, syncope, or chest pain. No edema.  Weight stable. Using O2 all the time. 2L by [place]. Track sats with pulse ox. She continues on adcirca 40 mg daily and Macitentan 10. Raynaud's much improved.  She denies orthopnea, PND.  No chest pain.  No syncope.   ROS: All systems negative except as listed in HPI, PMH and Problem List.  Past Medical History  Diagnosis Date  . Hypertension   . Migraine   . Hyperlipidemia   . GERD (gastroesophageal reflux disease)   . Thyroid nodule   . CREST (calcinosis, Raynaud's phenomenon, esophageal dysfunction, sclerodactyly, telangiectasia)   . CREST syndrome   . Allergy history unknown   . SOB (shortness of breath)   . Heart palpitations   . Osteoarthrosis     unspecified whether generalized or localized  . Hypertension   . Pulmonary hypertension     Current Outpatient Prescriptions  Medication Sig Dispense Refill  . aspirin 81 MG tablet Take 81 mg by mouth daily.      .Marland Kitchen  Calcium-Vitamin D-Vitamin K 500-500-40 MG-UNT-MCG CHEW Chew by mouth 2 (two) times daily.    . cyclobenzaprine (FLEXERIL) 10 MG tablet Take 10 mg by mouth as needed.     . diltiazem (CARDIZEM CD) 240 MG 24 hr capsule TAKE ONE CAPSULE BY MOUTH ONCE DAILY 90 capsule 3  . ezetimibe (ZETIA) 10 MG tablet Take 10 mg by mouth daily.    . furosemide (LASIX) 20 MG tablet Take 1 tablet (20 mg total) by mouth daily. 90 tablet 3  . HYDROcodone-acetaminophen (VICODIN) 5-500 MG per tablet Take 1 tablet by mouth as needed. migraines    . ibuprofen (ADVIL,MOTRIN) 800 MG tablet Take 800 mg by mouth as needed.     . lansoprazole (PREVACID) 30 MG capsule Take 30 mg by mouth 2  (two) times daily.      Marland Kitchen loratadine (CLARITIN) 10 MG tablet Take 10 mg by mouth daily.      . Macitentan (OPSUMIT) 10 MG TABS Take 10 mg by mouth daily. 30 tablet 3  . meclizine (ANTIVERT) 25 MG tablet Take 25 mg by mouth 3 (three) times daily as needed.      . naproxen (NAPROSYN) 500 MG tablet Take 500 mg by mouth as needed.     . potassium chloride (K-DUR) 10 MEQ tablet TAKE ONE TABLET BY MOUTH ONCE DAILY 90 tablet 3  . rosuvastatin (CRESTOR) 5 MG tablet Take 2.5 mg by mouth daily.    . SUMAtriptan (IMITREX) 50 MG tablet Take 50 mg by mouth as needed.     . Tadalafil, PAH, (ADCIRCA) 20 MG TABS Take 1 tablet (20 mg total) by mouth 2 (two) times daily. 180 tablet 3  . vitamin E (VITAMIN E) 400 UNIT capsule Take 400 Units by mouth daily.       No current facility-administered medications for this encounter.     PHYSICAL EXAM: Filed Vitals:   10/02/14 1354  BP: 110/60  Pulse: 82  Weight: 176 lb 12.8 oz (80.196 kg)  SpO2: 97%    General:  Well appearing. No resp difficulty (Husband present) HEENT: normal Neck: supple. JVP 6-7 Carotids 2+ bilaterally; no bruits. No lymphadenopathy or thryomegaly appreciated. Cor: PMI normal. Regular rate & rhythm. No rubs, S3/S4, murmur. Mildly increased P2 Lungs: clear Abdomen: soft, nontender, nondistended. No hepatosplenomegaly. No bruits or masses. Good bowel sounds. Extremities: no cyanosis, clubbing, rash, trace ankle edema. Hyperemia and blanching in hands + telangectasias Neuro: alert & orientedx3, cranial nerves grossly intact. Moves all 4 extremities w/o difficulty. Affect pleasant.   ASSESSMENT & PLAN:  1. Pulmonary arterial HTN 2. CREST syndrome  3. HTN  Clinically symptoms are stable, NYHA II. comparable to the past.  Echo looks good today, RV is normal and estimated PA systolic pressure is only mildly elevated (improved compared to echo a year ago).  I will check BMET and BNP today.  Continue Adcirca and Opsumit, no changes to  regimen for now.  If she has any clinical worsening, can repeat RHC and consider addition of Selexipeg.  Return in 4 months.    Marca Ancona 10/04/2014

## 2014-10-09 ENCOUNTER — Telehealth (HOSPITAL_COMMUNITY): Payer: Self-pay | Admitting: *Deleted

## 2014-10-09 NOTE — Telephone Encounter (Signed)
Completed PA for pt's Adcirca, med approved 09/09/14-10/09/15

## 2014-12-11 IMAGING — CR DG CHEST 2V
2 series · 2 of 2 positions shown · non-contrast
Comparison: None.

CLINICAL DATA: Pre cardiac catheterization, chest pain, shortness
of breath

CHEST - 2 VIEW

[w chest pa]
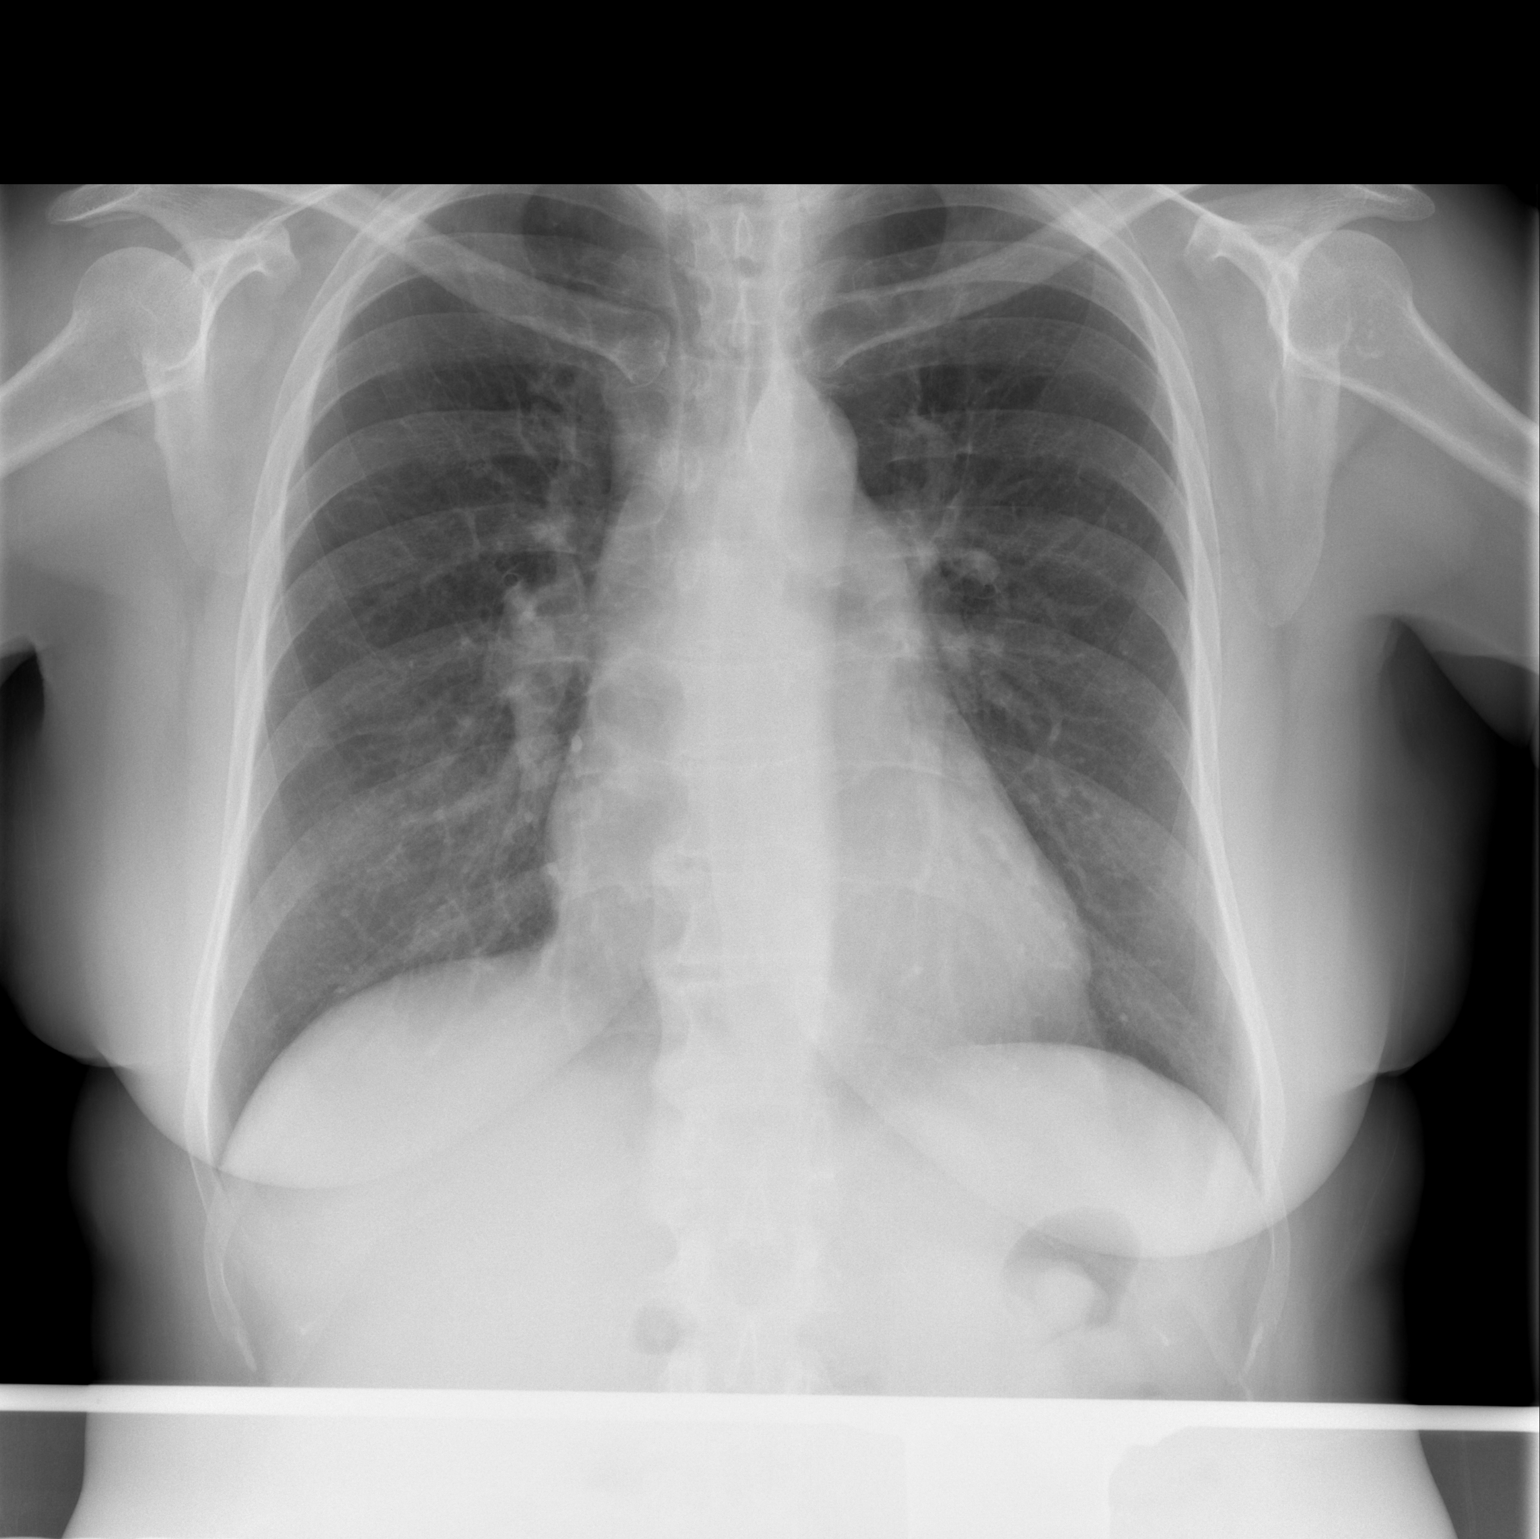

[w chest lat]
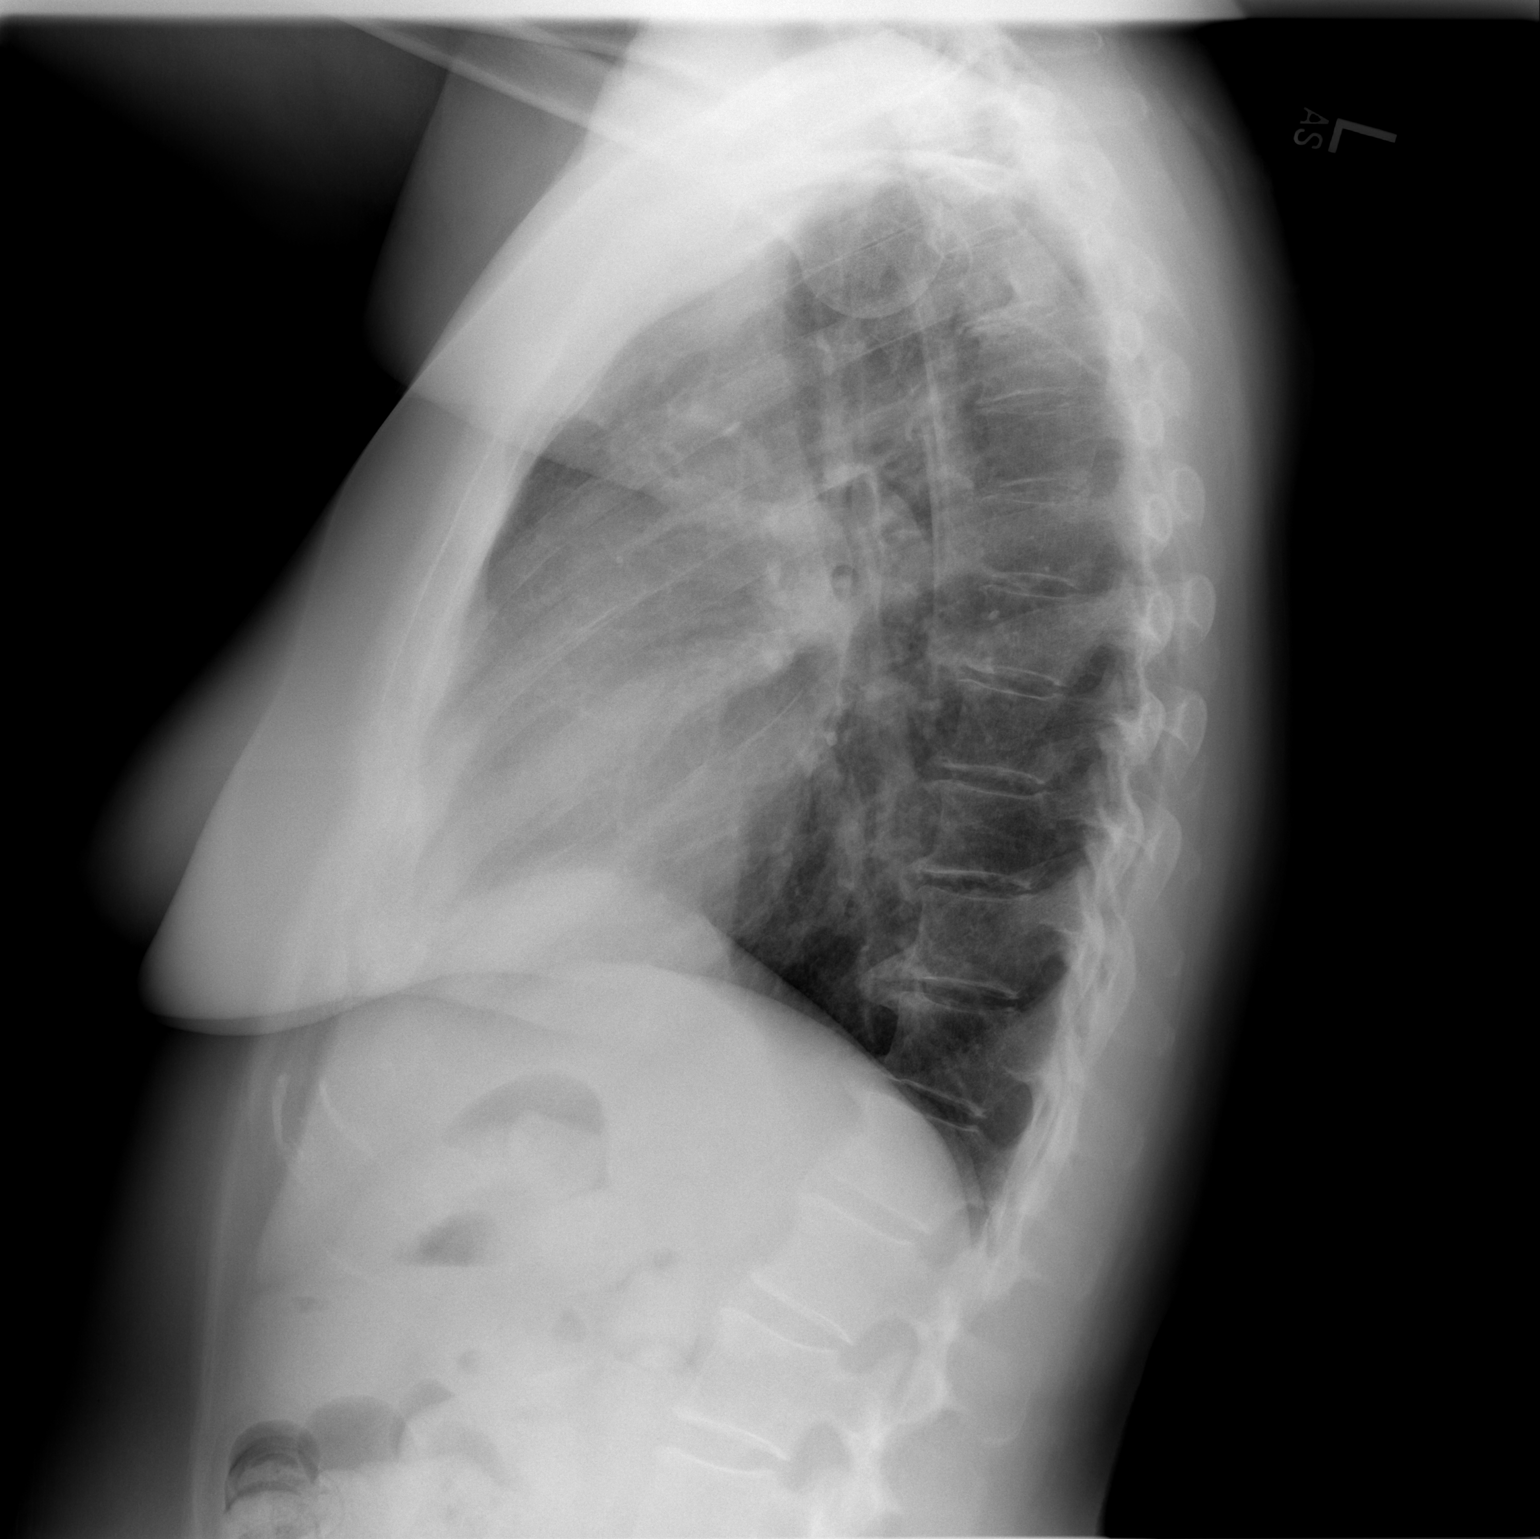

[2 of 2 positions shown; findings below may reference images not displayed]

FINDINGS: No active infiltrate or effusion is seen.  There is mild
cardiomegaly present.  Mediastinal contours appear normal.  There
are degenerative changes in the lower thoracic spine.
IMPRESSION: Mild cardiomegaly.  No active lung disease.

## 2014-12-19 ENCOUNTER — Other Ambulatory Visit: Payer: Self-pay | Admitting: Cardiology

## 2015-01-26 ENCOUNTER — Ambulatory Visit (HOSPITAL_COMMUNITY)
Admission: RE | Admit: 2015-01-26 | Discharge: 2015-01-26 | Disposition: A | Discharge status: Home / Self Care | Payer: Medicare Other | Source: Ambulatory Visit | Attending: Cardiology | Admitting: Cardiology

## 2015-01-26 VITALS — BP 108/78 | HR 93 | Wt 173.8 lb

## 2015-01-26 DIAGNOSIS — Z79899 Other long term (current) drug therapy: Secondary | ICD-10-CM | POA: Insufficient documentation

## 2015-01-26 DIAGNOSIS — M1990 Unspecified osteoarthritis, unspecified site: Secondary | ICD-10-CM | POA: Diagnosis not present

## 2015-01-26 DIAGNOSIS — E785 Hyperlipidemia, unspecified: Secondary | ICD-10-CM | POA: Insufficient documentation

## 2015-01-26 DIAGNOSIS — I270 Primary pulmonary hypertension: Secondary | ICD-10-CM

## 2015-01-26 DIAGNOSIS — I272 Other secondary pulmonary hypertension: Secondary | ICD-10-CM | POA: Insufficient documentation

## 2015-01-26 DIAGNOSIS — Z7982 Long term (current) use of aspirin: Secondary | ICD-10-CM | POA: Insufficient documentation

## 2015-01-26 DIAGNOSIS — I10 Essential (primary) hypertension: Secondary | ICD-10-CM | POA: Diagnosis not present

## 2015-01-26 DIAGNOSIS — K219 Gastro-esophageal reflux disease without esophagitis: Secondary | ICD-10-CM | POA: Insufficient documentation

## 2015-01-26 DIAGNOSIS — M341 CR(E)ST syndrome: Secondary | ICD-10-CM | POA: Insufficient documentation

## 2015-01-26 LAB — BASIC METABOLIC PANEL
Anion gap: 14 (ref 5–15)
BUN: 10 mg/dL (ref 6–20)
CALCIUM: 10.5 mg/dL — AB (ref 8.9–10.3)
CO2: 21 mmol/L — ABNORMAL LOW (ref 22–32)
CREATININE: 0.87 mg/dL (ref 0.44–1.00)
Chloride: 106 mmol/L (ref 101–111)
Glucose, Bld: 102 mg/dL — ABNORMAL HIGH (ref 65–99)
Potassium: 3.8 mmol/L (ref 3.5–5.1)
SODIUM: 141 mmol/L (ref 135–145)

## 2015-01-26 LAB — BRAIN NATRIURETIC PEPTIDE: B NATRIURETIC PEPTIDE 5: 92.9 pg/mL (ref 0.0–100.0)

## 2015-01-26 NOTE — Progress Notes (Signed)
 completed. Patient ambulated 1340 ft (408.62m) HR ranged from 106-141 O2 sats ranged from 89-98% on pulsed  of oxygen. No rest breaks were needed and patient ambulated without assistance/walking device

## 2015-01-26 NOTE — Patient Instructions (Signed)
Labs today  Your physician recommends that you schedule a follow-up appointment in: December

## 2015-01-27 ENCOUNTER — Other Ambulatory Visit (HOSPITAL_COMMUNITY): Payer: Self-pay | Admitting: Internal Medicine

## 2015-01-27 NOTE — Telephone Encounter (Signed)
bensimhon refill. Thank you for your time. 

## 2015-01-27 NOTE — Progress Notes (Signed)
Patient ID: Olivia Washington, female   DOB: 1946/01/14, 69 y.o.   MRN: [id number] Primary Care: Dr. Terrill Mohr [facility])  Rheumatologist: Dr. Dareen Piano => Dr. Dierdre Forth ENT: Dr. Annalee Genta   HPI:  [name] is a 69 y.o. 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 Non-smoker.   PH Work up:  TTE 05/13/12: LVEF 55-60%. Grade 1 diastolic dysfunction. Ventricular septum with diastolic flattening. LA mildly dilated. RV mildly dilated with systolic function mod reduced. RA mildly dilated. Atrial septum bowed right to left. PAPP 54 mmHg.  CT scan chest 12/3/130 showed no ILD, no acute or chronic PE but some dilation of the PA's. Left thyroid enlargement with rightward tracheal deviation (ENT eval pending)  VQ scan 05/27/12: normal, no PE  RHC 05/24/12  RA: 11/8/6  RV: 86/7  PA: 78/27 (47) PCWP: 8/8/5   Cardiac Output  Thermodilution: 3.6 with index of 1.9  Fick : 5.9 with index of 3.1  11.6 woods units  At final adenosine dose  76/28 (46)  CO (thermo) = 4.3 with index of 2.3  9.5 Woods units  LHC: normal cors  PFTs and sleep study completed  07/03/12 Sleep study 1. Small numbers of obstructive events which do not meet the AHI  criteria for the obstructive sleep apnea syndrome.  2. Transient REM associated desaturation as low as 82% during the  night. However, the patient only spent 23 minutes the entire night  less than 88%  ECHO 5/14: Normal LV. RV mildly dilated with normal systolic function, peak PA 54 Echo 09/23/13: EF 65-70% RV normal. RVSP Echo 4/16: EF 55-60%, mild LVH, normal RV size and systolic function, PA systolic pressure 39 mmHg.   06/22/12 1150 ft, O2 sat did drop to 82% on RA and pt was placed on 2L O2 via [place] and O2 sat improved to 92%.  11/14: 6 min walk test completed, pt ambulated 1110 ft (338 m), O2 sat ranged from 89-99 % on 2L, HR ranged from  84-113 1160 ft on O2 6L 09/23/13 1000 ft sats 88% 2L. (different person walking with her)  7/15  1350 feet 87-100%  4/16 1200 feet, 366 m 8/16 1340 feet, 408 m  She presents for follow up. Stable generally.  No shortness of breath walking on flat ground.  She is short of breath walking up a hill or up a long flight of steps.  No lightheadedness, syncope, or chest pain. No edema.  Weight stable. Using O2 all the time. She continues on Adcirca 40 mg daily and Macitentan 10 mg daily. Raynaud's stable.  She denies orthopnea, PND.  No dysphagia.  ROS: All systems negative except as listed in HPI, PMH and Problem List.  Past Medical History  Diagnosis Date  . Hypertension   . Migraine   . Hyperlipidemia   . GERD (gastroesophageal reflux disease)   . Thyroid nodule   . CREST (calcinosis, Raynaud's phenomenon, esophageal dysfunction, sclerodactyly, telangiectasia)   . CREST syndrome   . Allergy history unknown   . SOB (shortness of breath)   . Heart palpitations   . Osteoarthrosis     unspecified whether generalized or localized  . Hypertension   . Pulmonary hypertension     Current Outpatient Prescriptions  Medication Sig Dispense Refill  . aspirin 81 MG tablet Take 81 mg by mouth daily.      . Calcium-Vitamin D-Vitamin  K 500-500-40 MG-UNT-MCG CHEW Chew by mouth 2 (two) times daily.    . cyclobenzaprine (FLEXERIL) 10 MG tablet Take 10 mg by mouth as needed.     . diltiazem (CARDIZEM CD) 240 MG 24 hr capsule TAKE ONE CAPSULE BY MOUTH ONCE DAILY 90 capsule 3  . ezetimibe (ZETIA) 10 MG tablet Take 10 mg by mouth daily.    . furosemide (LASIX) 20 MG tablet TAKE 1 TABLET DAILY 90 tablet 2  . HYDROcodone-acetaminophen (VICODIN) 5-500 MG per tablet Take 1 tablet by mouth as needed. migraines    . ibuprofen (ADVIL,MOTRIN) 800 MG tablet Take 800 mg by mouth as needed.     . lansoprazole (PREVACID) 30 MG capsule Take 30 mg by mouth 2 (two) times daily.      Marland Kitchen loratadine  (CLARITIN) 10 MG tablet Take 10 mg by mouth daily.      . Macitentan (OPSUMIT) 10 MG TABS Take 10 mg by mouth daily. 30 tablet 3  . meclizine (ANTIVERT) 25 MG tablet Take 25 mg by mouth 3 (three) times daily as needed.      . naproxen (NAPROSYN) 500 MG tablet Take 500 mg by mouth as needed.     . potassium chloride (K-DUR) 10 MEQ tablet TAKE ONE TABLET BY MOUTH ONCE DAILY 90 tablet 3  . rosuvastatin (CRESTOR) 5 MG tablet Take 2.5 mg by mouth daily.    . SUMAtriptan (IMITREX) 50 MG tablet Take 50 mg by mouth as needed.     . Tadalafil, PAH, (ADCIRCA) 20 MG TABS Take 1 tablet (20 mg total) by mouth 2 (two) times daily. 180 tablet 3  . vitamin E (VITAMIN E) 400 UNIT capsule Take 400 Units by mouth daily.       No current facility-administered medications for this encounter.     PHYSICAL EXAM: Filed Vitals:   01/26/15 1204  BP: 108/78  Pulse: 93  Weight: 173 lb 12.8 oz (78.835 kg)  SpO2: 95%    General:  Well appearing. No resp difficulty (Husband present) HEENT: normal Neck: supple. JVP 6-7 Carotids 2+ bilaterally; no bruits. No lymphadenopathy or thyromegaly appreciated. Cor: PMI normal. Regular rate & rhythm. No rubs, S3/S4, murmur. Mildly increased P2 Lungs: clear Abdomen: soft, nontender, nondistended. No hepatosplenomegaly. No bruits or masses. Good bowel sounds. Extremities: no cyanosis, clubbing, rash, trace ankle edema. Hyperemia and blanching in hands + telangectasias Neuro: alert & orientedx3, cranial nerves grossly intact. Moves all 4 extremities w/o difficulty. Affect pleasant.   ASSESSMENT & PLAN:  1. Pulmonary arterial HTN 2. CREST syndrome  3. HTN  Clinically symptoms are stable, NYHA II. improved compared to last visit and quite good.  Echo at last appointment showed a normal-appearing RV and estimated PA systolic pressure to be only mildly elevated.  I will check BMET and BNP today.  Continue Adcirca and Opsumit, no changes to regimen for now.  If she has any  clinical worsening, can repeat RHC and consider addition of Selexipeg.  Return in 4 months.    Marca Ancona 01/27/2015

## 2015-02-01 ENCOUNTER — Other Ambulatory Visit (HOSPITAL_COMMUNITY): Payer: Self-pay | Admitting: *Deleted

## 2015-02-01 MED ORDER — DILTIAZEM HCL ER COATED BEADS 240 MG PO CP24: ORAL_CAPSULE | ORAL | Status: DC

## 2015-03-04 ENCOUNTER — Other Ambulatory Visit: Payer: Self-pay | Admitting: Internal Medicine

## 2015-05-04 ENCOUNTER — Encounter (HOSPITAL_COMMUNITY): Payer: Self-pay | Admitting: Internal Medicine

## 2015-05-12 ENCOUNTER — Encounter (HOSPITAL_COMMUNITY): Payer: Medicare Other

## 2015-05-26 ENCOUNTER — Other Ambulatory Visit (HOSPITAL_COMMUNITY): Payer: Self-pay | Admitting: *Deleted

## 2015-05-26 DIAGNOSIS — I5022 Chronic systolic (congestive) heart failure: Secondary | ICD-10-CM

## 2015-05-26 MED ORDER — TADALAFIL (PAH) 20 MG PO TABS: 1.0000 | ORAL_TABLET | Freq: Two times a day (BID) | ORAL | Status: DC

## 2015-05-26 MED ORDER — MACITENTAN 10 MG PO TABS: 10.0000 mg | ORAL_TABLET | Freq: Every day | ORAL | Status: DC

## 2015-05-26 NOTE — Telephone Encounter (Signed)
Per pt her insurance told her that she has to start [facility] for ops[facility]it and [facility]a [facility] of [facility], [facility]

## 2015-06-09 ENCOUNTER — Encounter (HOSPITAL_COMMUNITY): Payer: Medicare Other | Admitting: Internal Medicine

## 2015-06-18 ENCOUNTER — Other Ambulatory Visit (HOSPITAL_COMMUNITY): Payer: Self-pay | Admitting: *Deleted

## 2015-06-18 MED ORDER — MACITENTAN 10 MG PO TABS: 10.0000 mg | ORAL_TABLET | Freq: Every day | ORAL | Status: DC

## 2015-06-18 NOTE — Telephone Encounter (Signed)
Called [facility] at [phone] and gave VO for Opsumit

## 2015-06-22 ENCOUNTER — Telehealth (HOSPITAL_COMMUNITY): Payer: Self-pay | Admitting: *Deleted

## 2015-06-22 NOTE — Telephone Encounter (Signed)
Completed PA for pt's Opsumit through [facility], med approved 06/21/15-06/20/16

## 2015-06-30 ENCOUNTER — Other Ambulatory Visit (HOSPITAL_COMMUNITY): Payer: Self-pay | Admitting: *Deleted

## 2015-06-30 MED ORDER — FUROSEMIDE 20 MG PO TABS: 20.0000 mg | ORAL_TABLET | Freq: Every day | ORAL | Status: DC

## 2015-08-19 ENCOUNTER — Telehealth (HOSPITAL_COMMUNITY): Payer: Self-pay | Admitting: *Deleted

## 2015-08-19 DIAGNOSIS — I5022 Chronic systolic (congestive) heart failure: Secondary | ICD-10-CM

## 2015-08-19 MED ORDER — TADALAFIL (PAH) 20 MG PO TABS: 2.0000 | ORAL_TABLET | Freq: Every day | ORAL | Status: DC

## 2015-08-19 NOTE — Telephone Encounter (Signed)
Pt called to make sure when she orders Adcirca it will go through, called [facility], they state there is a PA on file, 06/05/15-10/09/15, approval # [id number] but they do not have a prescription, gave VO, pt aware

## 2015-09-02 ENCOUNTER — Ambulatory Visit (HOSPITAL_COMMUNITY)
Admission: RE | Admit: 2015-09-02 | Discharge: 2015-09-02 | Disposition: A | Discharge status: Home / Self Care | Payer: Medicare Other | Source: Ambulatory Visit | Attending: Cardiology | Admitting: Cardiology

## 2015-09-02 ENCOUNTER — Encounter (HOSPITAL_COMMUNITY): Payer: Self-pay

## 2015-09-02 VITALS — BP 130/52 | HR 85 | Ht 65.0 in | Wt 186.0 lb

## 2015-09-02 DIAGNOSIS — M1990 Unspecified osteoarthritis, unspecified site: Secondary | ICD-10-CM | POA: Diagnosis not present

## 2015-09-02 DIAGNOSIS — K219 Gastro-esophageal reflux disease without esophagitis: Secondary | ICD-10-CM | POA: Insufficient documentation

## 2015-09-02 DIAGNOSIS — I272 Other secondary pulmonary hypertension: Secondary | ICD-10-CM | POA: Diagnosis not present

## 2015-09-02 DIAGNOSIS — I10 Essential (primary) hypertension: Secondary | ICD-10-CM | POA: Insufficient documentation

## 2015-09-02 DIAGNOSIS — Z7982 Long term (current) use of aspirin: Secondary | ICD-10-CM | POA: Insufficient documentation

## 2015-09-02 DIAGNOSIS — E785 Hyperlipidemia, unspecified: Secondary | ICD-10-CM | POA: Diagnosis not present

## 2015-09-02 DIAGNOSIS — M341 CR(E)ST syndrome: Secondary | ICD-10-CM | POA: Diagnosis not present

## 2015-09-02 DIAGNOSIS — Z79899 Other long term (current) drug therapy: Secondary | ICD-10-CM | POA: Diagnosis not present

## 2015-09-02 NOTE — Patient Instructions (Signed)
Follow up and Echo with Dr.Bensimhon in 4 months

## 2015-09-02 NOTE — Progress Notes (Signed)
ADVANCED HF CLINIC NOTE  Patient ID: Olivia Washington, female   DOB: December 07, 1945, 70 y.o.   MRN: [id number] Patient ID: Olivia Washington, female   DOB: December 07, 1945, 70 y.o.   MRN: [id number] Primary Care: Dr. Terrill MohrKevin Kurtz [facility]([place])  Rheumatologist: Dr. Dareen PianoAnderson => Dr. Dierdre ForthBeekman ENT: Dr. Annalee GentaShoemaker   HPI:  Ms. Olivia Washington is a 70 y.o. 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 Non-smoker.   PH Work up:  TTE 05/13/12: LVEF 55-60%. Grade 1 diastolic dysfunction. Ventricular septum with diastolic flattening. LA mildly dilated. RV mildly dilated with systolic function mod reduced. RA mildly dilated. Atrial septum bowed right to left. PAPP 54 mmHg.  CT scan chest 12/3/130 showed no ILD, no acute or chronic PE but some dilation of the PA's. Left thyroid enlargement with rightward tracheal deviation (ENT eval pending)  VQ scan 05/27/12: normal, no PE  RHC 05/24/12  RA: 11/8/6  RV: 86/7  PA: 78/27 (47) PCWP: 8/8/5   Cardiac Output  Thermodilution: 3.6 with index of 1.9  Fick : 5.9 with index of 3.1  11.6 woods units  At final adenosine dose  76/28 (46)  CO (thermo) = 4.3 with index of 2.3  9.5 Woods units  LHC: normal cors  PFTs and sleep study completed  07/03/12 Sleep study 1. Small numbers of obstructive events which do not meet the AHI  criteria for the obstructive sleep apnea syndrome.  2. Transient REM associated desaturation as low as 82% during the  night. However, the patient only spent 23 minutes the entire night  less than 88%  ECHO 5/14: Normal LV. RV mildly dilated with normal systolic function, peak PA 54 Echo 09/23/13: EF 65-70% RV normal. RVSP 63mmHG Echo 4/16: EF 55-60%, mild LVH, normal RV size and systolic function, PA systolic pressure 39 mmHg.   6MW 06/22/12 1150 ft, O2 sat did drop to 82% on RA and pt was placed on 2L O2 via [place] and O2 sat improved to 92%.  11/14: 6  min walk test completed, pt ambulated 1110 ft (338 m), O2 sat ranged from 89-99 % on 2L, HR ranged from 84-113 6MW 1160 ft on O2 6L 6MW 09/23/13 1000 ft sats 88% 2L. (different person walking with her)  6MW 7/15  1350 feet 87-100%  6MW 4/16 1200 feet, 366 m 6MW 8/16 1340 feet, 408 m  She presents for follow up. Doing well. Able to do all ADLs without too much problem. She is short of breath walking up a hill or up a long flight of steps.  No lightheadedness, syncope, or chest pain. Occasional edema - resolves with lasix.  Weight up 10 pounds - too little exercise, too much ice cream Using O2 all the time. She continues on Adcirca 40 mg daily and Macitentan 10 mg daily. Raynaud's stable.  ROS: All systems negative except as listed in HPI, PMH and Problem List.  Past Medical History  Diagnosis Date  . Hypertension   . Migraine   . Hyperlipidemia   . GERD (gastroesophageal reflux disease)   . Thyroid nodule   . CREST (calcinosis, Raynaud's phenomenon, esophageal dysfunction, sclerodactyly, telangiectasia) (HCC)   . CREST syndrome (HCC)   . Allergy history unknown   . SOB (shortness of breath)   . Heart palpitations   . Osteoarthrosis     unspecified whether generalized or localized  . Hypertension   . Pulmonary hypertension (HCC)  Current Outpatient Prescriptions  Medication Sig Dispense Refill  . aspirin 81 MG tablet Take 81 mg by mouth daily.      . Calcium-Vitamin D-Vitamin K 500-500-40 MG-UNT-MCG CHEW Chew by mouth 2 (two) times daily.    Marland Kitchen diltiazem (CARDIZEM CD) 240 MG 24 hr capsule TAKE ONE CAPSULE BY MOUTH ONCE DAILY 90 capsule 3  . furosemide (LASIX) 20 MG tablet Take 1 tablet (20 mg total) by mouth daily. 90 tablet 2  . ibuprofen (ADVIL,MOTRIN) 800 MG tablet Take 800 mg by mouth as needed.     Marland Kitchen KLOR-CON M10 10 MEQ tablet TAKE ONE TABLET BY MOUTH ONCE DAILY 90 tablet 1  . lansoprazole (PREVACID) 30 MG capsule Take 30 mg by mouth 2 (two) times daily.      Marland Kitchen loratadine  (CLARITIN) 10 MG tablet Take 10 mg by mouth daily.      . Macitentan (OPSUMIT) 10 MG TABS Take 10 mg by mouth daily. 90 tablet 3  . naproxen (NAPROSYN) 500 MG tablet Take 500 mg by mouth as needed.     . potassium chloride (K-DUR) 10 MEQ tablet TAKE ONE TABLET BY MOUTH ONCE DAILY 90 tablet 3  . rosuvastatin (CRESTOR) 5 MG tablet Take 5 mg by mouth daily.     . Tadalafil, PAH, (ADCIRCA) 20 MG TABS Take 2 tablets (40 mg total) by mouth daily. 180 tablet 3  . vitamin E (VITAMIN E) 400 UNIT capsule Take 400 Units by mouth daily.       No current facility-administered medications for this encounter.     PHYSICAL EXAM: Filed Vitals:   09/02/15 1354  BP: 130/52  Pulse: 85  Height:  (1.651 m)  Weight: 186 lb (84.369 kg)  SpO2: 100%    General:  Well appearing. No resp difficulty (Husband present) HEENT: normal Neck: supple. JVP 6-7 Carotids 2+ bilaterally; no bruits. No lymphadenopathy or thyromegaly appreciated. Cor: PMI normal. Regular rate & rhythm. No rubs, S3/S4. 2/6 TR Mildly Increased P2 Lungs: clear Abdomen: soft, nontender, nondistended. No hepatosplenomegaly. No bruits or masses. Good bowel sounds. Extremities: no cyanosis, clubbing, rash, 1-2+ edema. Hyperemia and blanching in hands + telangectasias Neuro: alert & orientedx3, cranial nerves grossly intact. Moves all 4 extremities w/o difficulty. Affect pleasant.   ASSESSMENT & PLAN:  1. Pulmonary arterial HTN 2. CREST syndrome  3. HTN  Clinically symptoms are stable, NYHA II. improved compared to last visit and quite good.  Echo in 2016 showed a normal-appearing RV and estimated PA systolic pressure to be only mildly elevated. Mildly elevated fluid today - encouraged her to take lasix when she gets home - can take extra as needed. Continue Adcirca and Opsumit, no changes to regimen for now.  Will repeat echo at next visit. If RV strain present or if she has any clinical worsening, can repeat RHC and consider addition  of Selexipeg.  Return in 4 months.    Olivia Washington Meres MD 09/02/2015

## 2015-09-02 NOTE — Addendum Note (Signed)
Encounter addended by: Modesta MessingJasmine Q Kaydance Bowie, CMA on: 09/02/2015  2:35 PM<BR>     Documentation filed: Dx [employer], Patient Instructions Section, Orders

## 2015-09-06 ENCOUNTER — Other Ambulatory Visit: Payer: Self-pay | Admitting: Internal Medicine

## 2015-11-24 ENCOUNTER — Telehealth (HOSPITAL_COMMUNITY): Payer: Self-pay | Admitting: *Deleted

## 2015-11-24 NOTE — Telephone Encounter (Signed)
PA for Adcirca completed back in May, spoke w/pt today she states med was approved and it is being shipped from pharmacy.

## 2015-12-29 ENCOUNTER — Encounter (HOSPITAL_COMMUNITY): Payer: Self-pay | Admitting: Internal Medicine

## 2015-12-29 ENCOUNTER — Ambulatory Visit (HOSPITAL_COMMUNITY)
Admission: RE | Admit: 2015-12-29 | Discharge: 2015-12-29 | Disposition: A | Discharge status: Home / Self Care | Payer: Medicare Other | Source: Ambulatory Visit | Attending: Internal Medicine | Admitting: Internal Medicine

## 2015-12-29 ENCOUNTER — Ambulatory Visit (HOSPITAL_BASED_OUTPATIENT_CLINIC_OR_DEPARTMENT_OTHER)
Admission: RE | Admit: 2015-12-29 | Discharge: 2015-12-29 | Disposition: A | Discharge status: Home / Self Care | Payer: Medicare Other | Source: Ambulatory Visit | Attending: Internal Medicine | Admitting: Internal Medicine

## 2015-12-29 VITALS — BP 134/70 | HR 80 | Wt 178.4 lb

## 2015-12-29 DIAGNOSIS — I272 Other secondary pulmonary hypertension: Secondary | ICD-10-CM

## 2015-12-29 DIAGNOSIS — E785 Hyperlipidemia, unspecified: Secondary | ICD-10-CM | POA: Diagnosis not present

## 2015-12-29 DIAGNOSIS — K219 Gastro-esophageal reflux disease without esophagitis: Secondary | ICD-10-CM | POA: Diagnosis not present

## 2015-12-29 DIAGNOSIS — I7300 Raynaud's syndrome without gangrene: Secondary | ICD-10-CM | POA: Insufficient documentation

## 2015-12-29 DIAGNOSIS — I10 Essential (primary) hypertension: Secondary | ICD-10-CM | POA: Diagnosis not present

## 2015-12-29 DIAGNOSIS — Z7982 Long term (current) use of aspirin: Secondary | ICD-10-CM | POA: Insufficient documentation

## 2015-12-29 DIAGNOSIS — M341 CR(E)ST syndrome: Secondary | ICD-10-CM | POA: Insufficient documentation

## 2015-12-29 LAB — COMPREHENSIVE METABOLIC PANEL
ALBUMIN: 4.1 g/dL (ref 3.5–5.0)
ALT: 8 U/L — AB (ref 14–54)
AST: 15 U/L (ref 15–41)
Alkaline Phosphatase: 64 U/L (ref 38–126)
Anion gap: 6 (ref 5–15)
BUN: 11 mg/dL (ref 6–20)
CHLORIDE: 107 mmol/L (ref 101–111)
CO2: 26 mmol/L (ref 22–32)
CREATININE: 0.87 mg/dL (ref 0.44–1.00)
Calcium: 9.8 mg/dL (ref 8.9–10.3)
GFR calc Af Amer: >60 mL/min (ref >60)
GLUCOSE: 136 mg/dL — AB (ref 65–99)
POTASSIUM: 3.6 mmol/L (ref 3.5–5.1)
Sodium: 139 mmol/L (ref 135–145)
Total Bilirubin: 0.4 mg/dL (ref 0.3–1.2)
Total Protein: 6.4 g/dL — ABNORMAL LOW (ref 6.5–8.1)

## 2015-12-29 LAB — CBC
HEMATOCRIT: 37.3 % (ref 36.0–46.0)
Hemoglobin: 11.3 g/dL — ABNORMAL LOW (ref 12.0–15.0)
MCH: 23.6 pg — ABNORMAL LOW (ref 26.0–34.0)
MCHC: 30.3 g/dL (ref 30.0–36.0)
MCV: 78 fL (ref 78.0–100.0)
PLATELETS: 249 10*3/uL (ref 150–400)
RBC: 4.78 MIL/uL (ref 3.87–5.11)
RDW: 17.3 % — ABNORMAL HIGH (ref 11.5–15.5)
WBC: 8.3 10*3/uL (ref 4.0–10.5)

## 2015-12-29 NOTE — Patient Instructions (Signed)
Routine lab work today. Will notify you of abnormal results  Follow up in December.  Your provider requests you have a chest x ray and pulmonary function tests before your follow up.

## 2015-12-29 NOTE — Progress Notes (Addendum)
6 min walk test: Completed 1,480 feet (451.104 meters) (on 2L of O2)  Starting O2: 100 Starting Heart rate: 95  Ending O2:88 Ending Heart rate:140

## 2015-12-29 NOTE — Progress Notes (Signed)
  Echocardiogram 2D Echocardiogram has been performed.  Olivia Washington 12/29/2015, 1:42 PM

## 2015-12-29 NOTE — Progress Notes (Signed)
Patient ID: Olivia Washington, female   DOB: 1945/12/15, 70 y.o.   MRN: [id number]   ADVANCED HF CLINIC NOTE  Patient ID: Olivia Washington, female   DOB: 08/11/1945, 70 y.o.   MRN: [id number] Patient ID: Olivia Washington, female   DOB: [birth year], 70 y.o.   MRN: [id number] Primary Care: Dr. Terrill Mohr [facility])  Rheumatologist: Dr. Dareen Piano => Dr. Dierdre Forth ENT: Dr. Annalee Genta   HPI:  Ms. Balash is a 70 y.o. 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 Non-smoker.  She presents for follow up. Doing very well. Able to do all ADLs without too much problem. Remains active. She still gets SOB walking up a hill or up a long flight of steps.  No lightheadedness, syncope, or chest pain. Occasional edema - resolves with lasix.  Using O2 all the time. She continues on Adcirca 40 mg daily and Macitentan 10 mg daily. Raynaud's stable.  Echo today EF 55-60% RV normal. Trivial TR RVSP 35-40 IVC normal    PH Work up:  TTE 05/13/12: LVEF 55-60%. Grade 1 diastolic dysfunction. Ventricular septum with diastolic flattening. LA mildly dilated. RV mildly dilated with systolic function mod reduced. RA mildly dilated. Atrial septum bowed right to left. PAPP 54 mmHg.  CT scan chest 12/3/130 showed no ILD, no acute or chronic PE but some dilation of the PA's. Left thyroid enlargement with rightward tracheal deviation (ENT eval pending)  VQ scan 05/27/12: normal, no PE  RHC 05/24/12  RA: 11/8/6  RV: 86/7  PA: 78/27 (47) PCWP: 8/8/5   Cardiac Output  Thermodilution: 3.6 with index of 1.9  Fick : 5.9 with index of 3.1  11.6 woods units  At final adenosine dose  76/28 (46)  CO (thermo) = 4.3 with index of 2.3  9.5 Woods units  LHC: normal cors  PFTs and sleep study completed  07/03/12 Sleep study 1. Small numbers of obstructive events which do not meet the AHI  criteria for the obstructive sleep apnea  syndrome.  2. Transient REM associated desaturation as low as 82% during the  night. However, the patient only spent 23 minutes the entire night  less than 88%  ECHO 5/14: Normal LV. RV mildly dilated with normal systolic function, peak PA 54 Echo 09/23/13: EF 65-70% RV normal. RVSP Echo 4/16: EF 55-60%, mild LVH, normal RV size and systolic function, PA systolic pressure 39 mmHg.   06/22/12 1150 ft, O2 sat did drop to 82% on RA and pt was placed on 2L O2 via [place] and O2 sat improved to 92%.  11/14: 6 min walk test completed, pt ambulated 1110 ft (338 m), O2 sat ranged from 89-99 % on 2L, HR ranged from 84-113 1160 ft on O2 6L 09/23/13 1000 ft sats 88% 2L. (different person walking with her)  7/15  1350 feet 87-100%  4/16 1200 feet, 366 m 8/16 1340 feet, 408 m  6 min walk test (today): Completed 1,480 feet (451.104 meters) (on 2L of O2)  Starting O2: 100 Starting Heart rate: 95  Ending O2:88 Ending Heart rate:140    ROS: All systems negative except as listed in HPI, PMH and Problem List.  Past Medical History:  Diagnosis Date  . Allergy history unknown   . CREST (calcinosis, Raynaud's phenomenon, esophageal dysfunction, sclerodactyly, telangiectasia) (HCC)   . CREST syndrome (HCC)   . GERD (gastroesophageal reflux disease)   .  Heart palpitations   . Hyperlipidemia   . Hypertension   . Hypertension   . Migraine   . Osteoarthrosis    unspecified whether generalized or localized  . Pulmonary hypertension (HCC)   . SOB (shortness of breath)   . Thyroid nodule     Current Outpatient Prescriptions  Medication Sig Dispense Refill  . aspirin 81 MG tablet Take 81 mg by mouth daily.      . Calcium-Vitamin D-Vitamin K 500-500-40 MG-UNT-MCG CHEW Chew by mouth 2 (two) times daily.    Marland Kitchen diltiazem (CARDIZEM CD) 240 MG 24 hr capsule TAKE ONE CAPSULE BY MOUTH ONCE DAILY 90 capsule 3  . furosemide (LASIX) 20 MG tablet Take 1 tablet (20 mg total) by mouth  daily. 90 tablet 2  . ibuprofen (ADVIL,MOTRIN) 800 MG tablet Take 800 mg by mouth as needed.     Marland Kitchen KLOR-CON M10 10 MEQ tablet TAKE ONE TABLET BY MOUTH ONCE DAILY 90 tablet 1  . lansoprazole (PREVACID) 30 MG capsule Take 30 mg by mouth 2 (two) times daily.      Marland Kitchen loratadine (CLARITIN) 10 MG tablet Take 10 mg by mouth daily.      . Macitentan (OPSUMIT) 10 MG TABS Take 10 mg by mouth daily. 90 tablet 3  . potassium chloride (K-DUR) 10 MEQ tablet TAKE ONE TABLET BY MOUTH ONCE DAILY 90 tablet 3  . rosuvastatin (CRESTOR) 5 MG tablet Take 5 mg by mouth daily.     . Tadalafil, PAH, (ADCIRCA) 20 MG TABS Take 2 tablets (40 mg total) by mouth daily. 180 tablet 3  . vitamin E (VITAMIN E) 400 UNIT capsule Take 400 Units by mouth daily.      . naproxen (NAPROSYN) 500 MG tablet Take 500 mg by mouth as needed.      No current facility-administered medications for this encounter.      PHYSICAL EXAM: Vitals:   12/29/15 1347  BP: 134/70  Pulse: 80  SpO2: 99%  Weight: 178 lb 6 oz (80.9 kg)    General:  Well appearing. No resp difficulty (Husband present) HEENT: normal Neck: supple. JVP 5 Carotids 2+ bilaterally; no bruits. No lymphadenopathy or thyromegaly appreciated. Cor: PMI normal. Regular rate & rhythm. No rubs, S3/S4. 2/6 TR Mildly Increased P2 Lungs: clear Abdomen: soft, nontender, nondistended. No hepatosplenomegaly. No bruits or masses. Good bowel sounds. Extremities: no cyanosis, clubbing, rash, 1+ edema. Hyperemia and blanching in hands + telangectasias Neuro: alert & orientedx3, cranial nerves grossly intact. Moves all 4 extremities w/o difficulty. Affect pleasant.   ASSESSMENT & PLAN:  1. Pulmonary arterial HTN 2. CREST syndrome  3. HTN  She is doing very well. NYHA II. Echo reviewed today and no evidence of PAH or RV strain. also continues to improve. Volume status looks good.   Will repeat PFTs and check CXR if any change in DLCO will need chest CT.   Arvilla Meres  MD 12/29/2015

## 2016-02-04 ENCOUNTER — Other Ambulatory Visit (HOSPITAL_COMMUNITY): Payer: Self-pay | Admitting: Internal Medicine

## 2016-02-10 ENCOUNTER — Other Ambulatory Visit: Payer: Self-pay | Admitting: Internal Medicine

## 2016-02-21 ENCOUNTER — Other Ambulatory Visit (HOSPITAL_COMMUNITY): Payer: Self-pay | Admitting: *Deleted

## 2016-02-21 DIAGNOSIS — I272 Pulmonary hypertension, unspecified: Secondary | ICD-10-CM

## 2016-02-21 DIAGNOSIS — M341 CR(E)ST syndrome: Secondary | ICD-10-CM

## 2016-04-01 ENCOUNTER — Other Ambulatory Visit (HOSPITAL_COMMUNITY): Payer: Self-pay | Admitting: Internal Medicine

## 2016-05-15 ENCOUNTER — Other Ambulatory Visit (HOSPITAL_COMMUNITY): Payer: Self-pay | Admitting: Internal Medicine

## 2016-05-18 ENCOUNTER — Telehealth (HOSPITAL_COMMUNITY): Payer: Self-pay | Admitting: Pharmacist

## 2016-05-18 NOTE — Telephone Encounter (Signed)
Adcirca PA approved by [employer] through 12/03/16.   Tyler DeisErika K. Bonnye FavaNicolsen, PharmD, BCPS, CPP Clinical Pharmacist Pager: [phone] Phone: [phone] 05/18/2016 10:25 AM

## 2016-05-19 ENCOUNTER — Encounter (HOSPITAL_COMMUNITY): Payer: Self-pay | Admitting: Pharmacist

## 2016-05-25 ENCOUNTER — Ambulatory Visit (HOSPITAL_COMMUNITY)
Admission: RE | Admit: 2016-05-25 | Discharge: 2016-05-25 | Disposition: A | Discharge status: Home / Self Care | Payer: Medicare Other | Source: Ambulatory Visit | Attending: Internal Medicine | Admitting: Internal Medicine

## 2016-05-25 ENCOUNTER — Encounter (HOSPITAL_COMMUNITY): Payer: Self-pay | Admitting: Internal Medicine

## 2016-05-25 VITALS — BP 152/80 | HR 103 | Wt 184.0 lb

## 2016-05-25 DIAGNOSIS — R942 Abnormal results of pulmonary function studies: Secondary | ICD-10-CM

## 2016-05-25 DIAGNOSIS — M341 CR(E)ST syndrome: Secondary | ICD-10-CM | POA: Insufficient documentation

## 2016-05-25 DIAGNOSIS — Z5189 Encounter for other specified aftercare: Secondary | ICD-10-CM | POA: Diagnosis not present

## 2016-05-25 DIAGNOSIS — K219 Gastro-esophageal reflux disease without esophagitis: Secondary | ICD-10-CM | POA: Diagnosis not present

## 2016-05-25 DIAGNOSIS — E785 Hyperlipidemia, unspecified: Secondary | ICD-10-CM | POA: Diagnosis not present

## 2016-05-25 DIAGNOSIS — Z79899 Other long term (current) drug therapy: Secondary | ICD-10-CM | POA: Diagnosis not present

## 2016-05-25 DIAGNOSIS — I272 Pulmonary hypertension, unspecified: Secondary | ICD-10-CM | POA: Diagnosis not present

## 2016-05-25 DIAGNOSIS — Z9889 Other specified postprocedural states: Secondary | ICD-10-CM | POA: Diagnosis not present

## 2016-05-25 DIAGNOSIS — I10 Essential (primary) hypertension: Secondary | ICD-10-CM | POA: Insufficient documentation

## 2016-05-25 DIAGNOSIS — Z7982 Long term (current) use of aspirin: Secondary | ICD-10-CM | POA: Insufficient documentation

## 2016-05-25 LAB — PULMONARY FUNCTION TEST
DL/VA % PRED: 42 %
DL/VA: 2.07 ml/min/mmHg/L
DLCO unc % pred: 37 %
DLCO unc: 9.65 ml/min/mmHg
FEF 25-75 POST: 1.72 L/s
FEF 25-75 Pre: 1.57 L/sec
FEF2575-%CHANGE-POST: 9 %
FEF2575-%PRED-POST: 84 %
FEF2575-%PRED-PRE: 77 %
FEV1-%Change-Post: 2 %
FEV1-%Pred-Post: 97 %
FEV1-%Pred-Pre: 95 %
FEV1-PRE: 2.3 L
FEV1-Post: 2.36 L
FEV1FVC-%Change-Post: 6 %
FEV1FVC-%PRED-PRE: 95 %
FEV6-%Change-Post: -2 %
FEV6-%Pred-Post: 100 %
FEV6-%Pred-Pre: 102 %
FEV6-Post: 3.05 L
FEV6-Pre: 3.12 L
FEV6FVC-%Change-Post: 1 %
FEV6FVC-%PRED-POST: 103 %
FEV6FVC-%Pred-Pre: 102 %
FVC-%Change-Post: -3 %
FVC-%PRED-POST: 96 %
FVC-%PRED-PRE: 99 %
FVC-POST: 3.07 L
FVC-PRE: 3.17 L
POST FEV1/FVC RATIO: 77 %
PRE FEV1/FVC RATIO: 72 %
Post FEV6/FVC ratio: 99 %
Pre FEV6/FVC Ratio: 98 %
RV % pred: 35 %
RV: 0.78 L
TLC % PRED: 77 %
TLC: 4.01 L

## 2016-05-25 MED ORDER — ALBUTEROL SULFATE (2.5 MG/3ML) 0.083% IN NEBU
2.5000 mg | INHALATION_SOLUTION | Freq: Once | RESPIRATORY_TRACT | Status: AC
  Administered 2016-05-25: 2.5 mg via RESPIRATORY_TRACT

## 2016-05-25 NOTE — Progress Notes (Signed)
Patient ID: Olivia [place], female   DOB: 26-Jan-1946, 70 y.o.   MRN: [id number]  Advanced Heart Failure Clinic Note  Primary Care: [facility]z [facility]  Rheumatologist: Dr. Dareen PianoAnderson => Dr. Dierdre ForthBeekman ENT: Dr. Annalee GentaShoemaker   HPI:  Olivia Washington is a 70 y.o. 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 Non-smoker.  Olivia Washington presents today for regular follow up.   Overall feels good.  Had PFTs today, so a little winded. [place] Olivia [place] walked from main [place]trance to [place] side of hospital and then to clinic without any difficulty.  Does have occasional vertigo. Occasionally lightheaded when getting out of bed. Takes lasix 20 mg daily. Takes an extra 20 mg on occasion (maybe every few months). Remains on Adcirca 40 mg daily and Macitentan 10 mg daily. Raynaud's stable.  Echo 12/29/15 EF 55-60% RV normal. Trivial TR RVSP 35-40 IVC normal    PFTs 05/25/16  FVC 3.17 (99%) FEV 2.30 (95%) FEV1/FVC 72 TLCO 4.01 (77%) DLCO 9.65 (37%)  PH Work up:  TTE 05/13/12: LVEF 55-60%. Grade 1 diastolic dysfunction. Ventricular septum with diastolic flattening. LA mildly dilated. RV mildly dilated with systolic function mod reduced. RA mildly dilated. Atrial septum bowed right to left. PAPP 54 mmHg.  CT scan chest 12/3/130 showed no ILD, no acute or chronic PE but some dilation of the PA's. Left thyroid enlargement with rightward tracheal deviation (ENT eval pending)  VQ scan 05/27/12: normal, no PE  RHC 05/24/12  RA: 11/8/6  RV: 86/7  PA: 78/27 (47) PCWP: 8/8/5   Cardiac Output  Thermodilution: 3.6 with index of 1.9  Fick : 5.9 with index of 3.1  11.6 woods units  At final adenosine dose  76/28 (46)  CO (thermo) = 4.3 with index of 2.3  9.5 Woods units  LHC: normal cors  PFTs and sleep study completed  07/03/12 Sleep study 1. Small numbers of obstructive events which  do not meet the AHI  criteria for the obstructive sleep apnea syndrome.  2. Transient REM associated desaturation as low as 82% during the  night. However, the patient only spent 23 minutes the entire night  less than 88%  ECHO 5/14: Normal LV. RV mildly dilated with normal systolic function, peak PA 54 Echo 09/23/13: EF 65-70% RV normal. RVSP 63mmHG Echo 4/16: EF 55-60%, mild LVH, normal RV size and systolic function, PA systolic pressure 39 mmHg.  Echo 7/17 EF 55-60% normal RV  RVSP 43mmHG  6MW 06/22/12 1150 ft, O2 sat did drop to 82% on RA and pt was placed on 2L O2 via [place] and O2 sat improved to 92%.  11/14: 6 min walk test completed, pt ambulated 1110 ft (338 m), O2 sat ranged from 89-99 % on 2L, HR ranged from 84-113 6MW 1160 ft on O2 6L 6MW 09/23/13 1000 ft sats 88% 2L. (different person walking with her)  6MW 7/15  1350 feet 87-100%  6MW 4/16 1200 feet, 366 m 6MW 8/16 1340 feet, 408 m 6MW  7/17  1480 feet 4355m on 2L O2 sat nadir at 88%   ROS: All systems negative except as listed in HPI, PMH and Problem List.  Past Medical History:  Diagnosis Date  . Allergy history unknown   . CREST (calcinosis, Raynaud's phenomenon, esophageal dysfunction, sclerodactyly, telangiectasia) (HCC)   . CREST syndrome (HCC)   . GERD (gastroesophageal reflux disease)   . Heart  palpitations   . Hyperlipidemia   . Hypertension   . Hypertension   . Migraine   . Osteoarthrosis    unspecified whether generalized or localized  . Pulmonary hypertension   . SOB (shortness of breath)   . Thyroid nodule     Current Outpatient Prescriptions  Medication Sig Dispense Refill  . aspirin 81 MG tablet Take 81 mg by mouth daily.      . Calcium-Vitamin D-Vitamin K 500-500-40 MG-UNT-MCG CHEW Chew by mouth 2 (two) times daily.    Marland Kitchen. diltiazem (CARDIZEM CD) 240 MG 24 hr capsule TAKE 1 CAPSULE EVERY DAY 90 capsule 3  . furosemide (LASIX) 20 MG tablet TAKE 1 TABLET (20 MG TOTAL) BY MOUTH DAILY. 90 tablet 1  .  ibuprofen (ADVIL,MOTRIN) 800 MG tablet Take 800 mg by mouth as needed.     Marland Kitchen. KLOR-CON M10 10 MEQ tablet TAKE ONE TABLET BY MOUTH ONCE DAILY 90 tablet 1  . lansoprazole (PREVACID) 30 MG capsule Take 30 mg by mouth 2 (two) times daily.      Marland Kitchen. loratadine (CLARITIN) 10 MG tablet Take 10 mg by mouth daily.      . naproxen (NAPROSYN) 500 MG tablet Take 500 mg by mouth as needed.     . OPSUMIT 10 MG TABS TAKE ONE TABLET (10MG ) BY MOUTH DAILY. DO NOT HANDLE IF PREGNANT. DO NOT SPLIT, CRUSH, OR CHEW. REVIEW MEDICATION GUIDE. CALL [phone] 90 tablet 2  . rosuvastatin (CRESTOR) 5 MG tablet Take 5 mg by mouth daily.     . Tadalafil, PAH, (ADCIRCA) 20 MG TABS Take 2 tablets (40 mg total) by mouth daily. 180 tablet 3  . vitamin E (VITAMIN E) 400 UNIT capsule Take 400 Units by mouth daily.       No current facility-administered medications for this encounter.      PHYSICAL EXAM: Vitals:   05/25/16 1411  BP: (!) 152/80  Pulse: (!) 103  SpO2: 97%  Weight: 184 lb (83.5 kg)    General:  Well appearing. NAD. Husband present.  HEENT: Normal effort Neck: supple. JVP ~6 cm. Carotids 2+ bilaterally; no bruits. No thyromegaly or nodule noted.  Cor: PMI normal. RRR. No rubs, S3/S4. 2/6 TR. P2 slightly increased.  Lungs: CTAB, normal effort Abdomen: soft, NT, ND, no HSM. No bruits or masses. +BS  Extremities: no cyanosis, clubbing, rash, trace ankle edema. Hyperemia and blanching in hands + telangectasias Neuro: Alert & orientedx3, cranial nerves grossly intact. Moves all 4 extremities w/o difficulty. Affect pleasant.  ASSESSMENT & PLAN:  1. Pulmonary arterial HTN 2. CREST syndrome  3. HTN  NYHA II symptoms.   PFTs without obstructive disease. DLCO 37%   Mariam DollarMichael Andrew Tillery 05/25/2016   Patient seen and examined with Otilio SaberAndy Tillery, PA-C. We discussed all aspects of the encounter. I agree with the assessment and plan as stated above.   Olivia Washington is doing well with NYHA I-II symptoms. Volume status  ok. RV strain resolved on echo.  Continue combination therapy for PAH. Can add selexipag as needed for clinical worsening. DLCO worse than I would have predicted based on clinic symptoms. Last CT chest in 12/13 with no parenchymal lung disease. Will proceed with hi-res chest CT to further evaluate for development of ILD.  BP slightly elevated here but otherwise well controlled.   Bensimhon, Daniel,MD 9:37 PM

## 2016-05-25 NOTE — Patient Instructions (Signed)
We will contact you in 4 months to schedule your next appointment and echocardiogram  

## 2016-05-30 ENCOUNTER — Telehealth (HOSPITAL_COMMUNITY): Payer: Self-pay | Admitting: Pharmacist

## 2016-05-30 NOTE — Telephone Encounter (Signed)
Opsumit appeal approved by [employer] for 6 months from 06/05/16.   Tyler DeisErika K. Bonnye FavaNicolsen, PharmD, BCPS, CPP Clinical Pharmacist Pager: [phone] Phone: [phone] 05/30/2016 2:37 PM

## 2016-05-31 ENCOUNTER — Telehealth (HOSPITAL_COMMUNITY): Payer: Self-pay | Admitting: *Deleted

## 2016-05-31 ENCOUNTER — Encounter (HOSPITAL_COMMUNITY): Payer: Self-pay | Admitting: Internal Medicine

## 2016-05-31 DIAGNOSIS — R942 Abnormal results of pulmonary function studies: Secondary | ICD-10-CM

## 2016-05-31 NOTE — Telephone Encounter (Signed)
Pt aware, CT order placed.  Pt sch for 1/2 at 1 pm, she is unsure she can make that time, provided pt w/rad sch number [phone] and advised her she can call back there to reschedule.

## 2016-05-31 NOTE — Telephone Encounter (Signed)
-----   Message from Dolores Pattyaniel R Bensimhon, MD sent at 05/29/2016  9:45 PM EST ----- Please order hi-res chest CT for abnormal PFTs. Thanks.

## 2016-06-06 ENCOUNTER — Ambulatory Visit (HOSPITAL_COMMUNITY)
Admission: RE | Admit: 2016-06-06 | Discharge: 2016-06-06 | Disposition: A | Discharge status: Home / Self Care | Payer: Medicare Other | Source: Ambulatory Visit | Attending: Internal Medicine | Admitting: Internal Medicine

## 2016-06-06 ENCOUNTER — Ambulatory Visit (HOSPITAL_COMMUNITY): Payer: Medicare Other

## 2016-06-06 DIAGNOSIS — N200 Calculus of kidney: Secondary | ICD-10-CM | POA: Diagnosis not present

## 2016-06-06 DIAGNOSIS — I700 Atherosclerosis of aorta: Secondary | ICD-10-CM | POA: Diagnosis not present

## 2016-06-06 DIAGNOSIS — K449 Diaphragmatic hernia without obstruction or gangrene: Secondary | ICD-10-CM | POA: Diagnosis not present

## 2016-06-06 DIAGNOSIS — R942 Abnormal results of pulmonary function studies: Secondary | ICD-10-CM | POA: Insufficient documentation

## 2016-06-06 DIAGNOSIS — E041 Nontoxic single thyroid nodule: Secondary | ICD-10-CM | POA: Insufficient documentation

## 2016-06-09 ENCOUNTER — Other Ambulatory Visit (HOSPITAL_COMMUNITY): Payer: Self-pay | Admitting: Internal Medicine

## 2016-07-13 ENCOUNTER — Other Ambulatory Visit (HOSPITAL_COMMUNITY): Payer: Self-pay | Admitting: *Deleted

## 2016-07-13 DIAGNOSIS — I5022 Chronic systolic (congestive) heart failure: Secondary | ICD-10-CM

## 2016-07-13 MED ORDER — TADALAFIL (PAH) 20 MG PO TABS: 40.0000 mg | ORAL_TABLET | Freq: Every day | ORAL | 3 refills | Status: DC

## 2016-07-18 ENCOUNTER — Other Ambulatory Visit (HOSPITAL_COMMUNITY): Payer: Self-pay | Admitting: *Deleted

## 2016-07-18 DIAGNOSIS — I5022 Chronic systolic (congestive) heart failure: Secondary | ICD-10-CM

## 2016-07-18 MED ORDER — TADALAFIL (PAH) 20 MG PO TABS: 40.0000 mg | ORAL_TABLET | Freq: Every day | ORAL | 3 refills | Status: DC

## 2016-08-04 ENCOUNTER — Other Ambulatory Visit: Payer: Self-pay | Admitting: Internal Medicine

## 2016-08-10 ENCOUNTER — Other Ambulatory Visit (HOSPITAL_COMMUNITY): Payer: Self-pay | Admitting: Internal Medicine

## 2016-08-10 DIAGNOSIS — I5022 Chronic systolic (congestive) heart failure: Secondary | ICD-10-CM

## 2016-09-27 ENCOUNTER — Encounter (HOSPITAL_COMMUNITY): Payer: Self-pay | Admitting: Internal Medicine

## 2016-09-27 ENCOUNTER — Ambulatory Visit (HOSPITAL_COMMUNITY)
Admission: RE | Admit: 2016-09-27 | Discharge: 2016-09-27 | Disposition: A | Discharge status: Home / Self Care | Payer: Medicare Other | Source: Ambulatory Visit | Attending: Internal Medicine | Admitting: Internal Medicine

## 2016-09-27 ENCOUNTER — Ambulatory Visit (HOSPITAL_BASED_OUTPATIENT_CLINIC_OR_DEPARTMENT_OTHER)
Admission: RE | Admit: 2016-09-27 | Discharge: 2016-09-27 | Disposition: A | Discharge status: Home / Self Care | Payer: Medicare Other | Source: Ambulatory Visit | Attending: Internal Medicine | Admitting: Internal Medicine

## 2016-09-27 VITALS — BP 128/72 | HR 72 | Wt 175.5 lb

## 2016-09-27 DIAGNOSIS — R942 Abnormal results of pulmonary function studies: Secondary | ICD-10-CM | POA: Diagnosis not present

## 2016-09-27 DIAGNOSIS — I272 Pulmonary hypertension, unspecified: Secondary | ICD-10-CM | POA: Insufficient documentation

## 2016-09-27 DIAGNOSIS — I5032 Chronic diastolic (congestive) heart failure: Secondary | ICD-10-CM | POA: Diagnosis not present

## 2016-09-27 NOTE — Progress Notes (Signed)
  Echocardiogram 2D Echocardiogram has been performed.  Nolon Rod 09/27/2016, 11:26 AM

## 2016-09-27 NOTE — Progress Notes (Signed)
6 min walk complete.  Pt ambulated 1240 ft (378 m) on 2L O2 sats ranged 85-96%, HR ranged 98-123.

## 2016-09-27 NOTE — Progress Notes (Signed)
Patient ID: Olivia Washington, female   DOB: 10-15-1945, 71 y.o.   MRN: [id number]  Advanced Heart Failure Clinic Note  Primary Care: Dr. Terrill Mohr [facility])  Rheumatologist: Dr. Dareen Piano => Dr. Dierdre Forth ENT: Dr. Annalee Genta   HPI:  Olivia Washington is a 71 y.o. 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 Non-smoker.   At last visit DLCO was down so we did hi-res CT. No ILD. Mosaic pattern c/w pHTN  She presents today for regular follow up. She feels "great" husband says "pretty good". Not walking as much. Having arthritis pain in her knees. No swelling, orthopnea or PND. today slightly worse that previous. No dizziness or presyncope. Very compliant with Adcirca and macitentan. Taking lasix 20 daily and extra 20 as need (maybe once per month)   Echo 12/29/15 EF 55-60% RV normal. Trivial TR RVSP 35-40 IVC normal    PFTs 05/25/16  FVC 3.17 (99%) FEV 2.30 (95%) FEV1/FVC 72 TLCO 4.01 (77%) DLCO 9.65 (37%)  PH Work up:  TTE 05/13/12: LVEF 55-60%. Grade 1 diastolic dysfunction. Ventricular septum with diastolic flattening. LA mildly dilated. RV mildly dilated with systolic function mod reduced. RA mildly dilated. Atrial septum bowed right to left. PAPP 54 mmHg.  CT scan chest 12/3/130 showed no ILD, no acute or chronic PE but some dilation of the PA's. Left thyroid enlargement with rightward tracheal deviation (ENT eval pending)  VQ scan 05/27/12: normal, no PE  RHC 05/24/12  RA: 11/8/6  RV: 86/7  PA: 78/27 (47) PCWP: 8/8/5   Cardiac Output  Thermodilution: 3.6 with index of 1.9  Fick : 5.9 with index of 3.1  11.6 woods units  At final adenosine dose  76/28 (46)  CO (thermo) = 4.3 with index of 2.3  9.5 Woods units  LHC: normal cors  PFTs and sleep study completed  07/03/12 Sleep study 1. Small numbers of obstructive events which do not meet the AHI  criteria for the  obstructive sleep apnea syndrome.  2. Transient REM associated desaturation as low as 82% during the  night. However, the patient only spent 23 minutes the entire night  less than 88%  ECHO 5/14: Normal LV. RV mildly dilated with normal systolic function, peak PA 54 Echo 09/23/13: EF 65-70% RV normal. RVSP Echo 4/16: EF 55-60%, mild LVH, normal RV size and systolic function, PA systolic pressure 39 mmHg.  Echo 7/17 EF 55-60% normal RV  RVSP  06/22/12 1150 ft, O2 sat did drop to 82% on RA and pt was placed on 2L O2 via [place] and O2 sat improved to 92%.  11/14: 6 min walk test completed, pt ambulated 1110 ft (338 m), O2 sat ranged from 89-99 % on 2L, HR ranged from 84-113 1160 ft on O2 6L 09/23/13 1000 ft sats 88% 2L. (different person walking with her)  7/15  1350 feet 87-100%  4/16 1200 feet, 366 m 8/16 1340 feet, 408 m  7/17  1480 feet 463m on 2L O2 sat nadir at 88%  09/27/16 1240 ft (378 m) on 2L O2 sats ranged 85-96%, HR ranged 98-123.   ROS: All systems negative except as listed in HPI, PMH and Problem List.  Past Medical History:  Diagnosis Date  . Allergy history unknown   . CREST (calcinosis, Raynaud's phenomenon, esophageal dysfunction, sclerodactyly, telangiectasia) (HCC)   . CREST syndrome (HCC)   .  GERD (gastroesophageal reflux disease)   . Heart palpitations   . Hyperlipidemia   . Hypertension   . Hypertension   . Migraine   . Osteoarthrosis    unspecified whether generalized or localized  . Pulmonary hypertension (HCC)   . SOB (shortness of breath)   . Thyroid nodule     Current Outpatient Prescriptions  Medication Sig Dispense Refill  . ADCIRCA 20 MG tablet TAKE TWO  TABLETS ( ) BY MOUTH ONCE DAILY. CALL [phone] TO REFILL. 180 tablet 2  . aspirin 81 MG tablet Take 81 mg by mouth daily.      . Calcium-Vitamin D-Vitamin K 500-500-40 MG-UNT-MCG CHEW Chew by mouth 2 (two) times daily.    Marland Kitchen  dextromethorphan-guaiFENesin (MUCINEX DM) 30-600 MG 12hr tablet Take 1 tablet by mouth 2 (two) times daily.    Marland Kitchen diltiazem (CARDIZEM CD) 240 MG 24 hr capsule TAKE 1 CAPSULE EVERY DAY 90 capsule 3  . furosemide (LASIX) 20 MG tablet TAKE 1 TABLET (20 MG TOTAL) BY MOUTH DAILY. 90 tablet 1  . ibuprofen (ADVIL,MOTRIN) 800 MG tablet Take 800 mg by mouth as needed.     Marland Kitchen KLOR-CON M10 10 MEQ tablet TAKE ONE TABLET BY MOUTH ONCE DAILY 90 tablet 1  . lansoprazole (PREVACID) 30 MG capsule Take 30 mg by mouth 2 (two) times daily.      Marland Kitchen loratadine (CLARITIN) 10 MG tablet Take 10 mg by mouth daily.      . naproxen (NAPROSYN) 500 MG tablet Take 500 mg by mouth as needed.     . OPSUMIT 10 MG TABS TAKE ONE TABLET ( ) BY MOUTH DAILY. DO NOT HANDLE IF PREGNANT. DO NOT SPLIT, CRUSH, OR CHEW. REVIEW MEDICATION GUIDE. CALL [phone] 90 tablet 2  . rosuvastatin (CRESTOR) 5 MG tablet Take 5 mg by mouth daily.     . vitamin E (VITAMIN E) 400 UNIT capsule Take 400 Units by mouth daily.       No current facility-administered medications for this encounter.      PHYSICAL EXAM: Vitals:   09/27/16 1212  BP: 128/72  Pulse: 72  SpO2: 99%  Weight: 175 lb 8 oz (79.6 kg)    General:  Well appearing. NAD. Husband present.  HEENT: Normal effort Neck: supple. JVP ~6 cm. Carotids 2+ bilaterally; no bruits. No thyromegaly or nodule noted.  Cor: PMI normal. RRR. No rubs, S3/S4. 2/6 TR. P2 slightly increased.  Lungs: CTAB, normal effort Abdomen: soft, NT, ND, no HSM. No bruits or masses. +BS  Extremities: no cyanosis, clubbing, rash, 1+ ankle edema. Hyperemia and blanching in hands + telangectasias Neuro: Alert & orientedx3, cranial nerves grossly intact. Moves all 4 extremities w/o difficulty. Affect pleasant.  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 CREST syndrome  3. HTN 4. Diastolic HF  Overall her symptoms are stable on combination therapy (NYHA II) but 6 MW and  DLCO (37%) are down slightly from previous. Echo with no evidence of RV strain. CT chest also reviewed personally and no evidence of ILD.  We discussed 3 options 1. Start selexipag 2. RHC to further evaluate  3. Trial of increasing exercise for 2 months with repeath 6 MW-> if no improvement RHC with plan to start selexipag   She wants to try option #3. We will follow closely. Knows to call me if getting worse.   BensimhonReuel Boom 09/27/2016

## 2016-09-27 NOTE — Patient Instructions (Signed)
Your physician recommends that you schedule a follow-up appointment in: 2 months  

## 2016-10-04 ENCOUNTER — Other Ambulatory Visit (HOSPITAL_COMMUNITY): Payer: Self-pay | Admitting: Internal Medicine

## 2016-10-05 ENCOUNTER — Other Ambulatory Visit (HOSPITAL_COMMUNITY): Payer: Self-pay | Admitting: Internal Medicine

## 2016-11-22 ENCOUNTER — Encounter (HOSPITAL_COMMUNITY): Payer: Self-pay | Admitting: Internal Medicine

## 2016-11-22 ENCOUNTER — Ambulatory Visit (HOSPITAL_COMMUNITY)
Admission: RE | Admit: 2016-11-22 | Discharge: 2016-11-22 | Disposition: A | Discharge status: Home / Self Care | Payer: Medicare Other | Source: Ambulatory Visit | Attending: Internal Medicine | Admitting: Internal Medicine

## 2016-11-22 ENCOUNTER — Telehealth (HOSPITAL_COMMUNITY): Payer: Self-pay

## 2016-11-22 VITALS — BP 136/70 | HR 90 | Wt 179.5 lb

## 2016-11-22 DIAGNOSIS — Z7982 Long term (current) use of aspirin: Secondary | ICD-10-CM | POA: Diagnosis not present

## 2016-11-22 DIAGNOSIS — Z9889 Other specified postprocedural states: Secondary | ICD-10-CM | POA: Insufficient documentation

## 2016-11-22 DIAGNOSIS — M341 CR(E)ST syndrome: Secondary | ICD-10-CM | POA: Diagnosis present

## 2016-11-22 DIAGNOSIS — I272 Pulmonary hypertension, unspecified: Secondary | ICD-10-CM

## 2016-11-22 DIAGNOSIS — E785 Hyperlipidemia, unspecified: Secondary | ICD-10-CM | POA: Diagnosis not present

## 2016-11-22 DIAGNOSIS — I110 Hypertensive heart disease with heart failure: Secondary | ICD-10-CM | POA: Diagnosis not present

## 2016-11-22 DIAGNOSIS — K222 Esophageal obstruction: Secondary | ICD-10-CM | POA: Insufficient documentation

## 2016-11-22 DIAGNOSIS — I10 Essential (primary) hypertension: Secondary | ICD-10-CM | POA: Diagnosis present

## 2016-11-22 DIAGNOSIS — K219 Gastro-esophageal reflux disease without esophagitis: Secondary | ICD-10-CM | POA: Insufficient documentation

## 2016-11-22 DIAGNOSIS — I2721 Secondary pulmonary arterial hypertension: Secondary | ICD-10-CM | POA: Diagnosis not present

## 2016-11-22 DIAGNOSIS — I5030 Unspecified diastolic (congestive) heart failure: Secondary | ICD-10-CM | POA: Diagnosis not present

## 2016-11-22 NOTE — Telephone Encounter (Addendum)
Mrs. Olivia Washington has been approved for Adcirca 20 mg tablets through 05/23/2017 and Opsimut 10 mg tablets through 06/04/2017 through her Medicare Part D benefit.   Olivia Washington PharmD Candidate

## 2016-11-22 NOTE — Progress Notes (Addendum)
[place] [facility] [place] [place] [phone]), they states pt will need pft's, they will fax referral form to our office, once completed fax along with pft order to them and they will schedule, pt is aware

## 2016-11-22 NOTE — Patient Instructions (Signed)
You have been [facility] [place], they will call you to schedule, you will have to have pulmonary function test done prior to starting  We will contact you in 3-4 months to schedule your next appointment.

## 2016-11-22 NOTE — Progress Notes (Signed)
6 min walk test completed, pt ambulated 1220 ft (372 m).  On 2 L O2 sats ranged 96%-88%, HR ranged 111-132

## 2016-11-22 NOTE — Progress Notes (Signed)
Patient ID: Olivia Washington, female   DOB: 03-15-1946, 71 y.o.   MRN: [id number]  Advanced Heart Failure Clinic Note  Primary Care: Dr. Terrill MohrKevin Kurtz [facility])  Rheumatologist: Dr. Dareen PianoAnderson => Dr. Dierdre ForthBeekman ENT: Dr. Annalee GentaShoemaker   HPI:  Olivia Washington is a 71 y.o. 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 Non-smoker.   At last visit DLCO was down so we did hi-res CT. No ILD. Mosaic pattern c/w pHTN  [facility] Overall feeing ok. Continues 2 liters oxygen. Having some vertigo that associates with sinus infection. Having a little dizziness when she stands. Weight at  home 173 pounds. Able to walk down the driveway 600 feet. Occasionally walks up the stairs. Can do most of her activities without too much limitation.   Echo 4/18 EF 60% RV normal IVC normal RVSP 49mmHG  Echo 12/29/15 EF 55-60% RV normal. Trivial TR RVSP 35-40 IVC normal    PFTs 05/25/16  FVC 3.17 (99%) FEV 2.30 (95%) FEV1/FVC 72 TLCO 4.01 (77%) DLCO 9.65 (37%)  PH Work up:  TTE 05/13/12: LVEF 55-60%. Grade 1 diastolic dysfunction. Ventricular septum with diastolic flattening. LA mildly dilated. RV mildly dilated with systolic function mod reduced. RA mildly dilated. Atrial septum bowed right to left. PAPP 54 mmHg.  CT scan chest 12/3/130 showed no ILD, no acute or chronic PE but some dilation of the PA's. Left thyroid enlargement with rightward tracheal deviation (ENT eval pending)  VQ scan 05/27/12: normal, no PE  RHC 05/24/12  RA: 11/8/6  RV: 86/7  PA: 78/27 (47) PCWP: 8/8/5   Cardiac Output  Thermodilution: 3.6 with index of 1.9  Fick : 5.9 with index of 3.1  11.6 woods units  At final adenosine dose  76/28 (46)  CO (thermo) = 4.3 with index of 2.3  9.5 Woods units  LHC: normal cors  PFTs and sleep study completed  07/03/12 Sleep study 1. Small numbers of obstructive events which  do not meet the AHI  criteria for the obstructive sleep apnea syndrome.  2. Transient REM associated desaturation as low as 82% during the  night. However, the patient only spent 23 minutes the entire night  less than 88%  ECHO 5/14: Normal LV. RV mildly dilated with normal systolic function, peak PA 54 Echo 09/23/13: EF 65-70% RV normal. RVSP 63mmHG Echo 4/16: EF 55-60%, mild LVH, normal RV size and systolic function, PA systolic pressure 39 mmHg.  Echo 7/17 EF 55-60% normal RV  RVSP 43mmHG  6MW 06/22/12 1150 ft, O2 sat did drop to 82% on RA and pt was placed on 2L O2 via [place] and O2 sat improved to 92%.  11/14: 6 min walk test completed, pt ambulated 1110 ft (338 m), O2 sat ranged from 89-99 % on 2L, HR ranged from 84-113 6MW 1160 ft on O2 6L 6MW 09/23/13 1000 ft sats 88% 2L. (different person walking with her)  6MW 7/15  1350 feet 87-100%  6MW 4/16 1200 feet, 366 m 6MW 8/16 1340 feet, 408 m 6MW  7/17  1480 feet 43041m on 2L O2 sat nadir at 88% 6MW  09/27/16 1240 ft (378 m) on 2L O2 sats ranged 85-96%, HR ranged 98-123. 6 MW 11/22/16 1220 ft (37566m)  ROS: All systems negative except as listed in HPI, PMH and Problem List.  Past Medical History:  Diagnosis Date  . Allergy history unknown   . CREST (  calcinosis, Raynaud's phenomenon, esophageal dysfunction, sclerodactyly, telangiectasia) (HCC)   . CREST syndrome (HCC)   . GERD (gastroesophageal reflux disease)   . Heart palpitations   . Hyperlipidemia   . Hypertension   . Hypertension   . Migraine   . Osteoarthrosis    unspecified whether generalized or localized  . Pulmonary hypertension (HCC)   . SOB (shortness of breath)   . Thyroid nodule     Current Outpatient Prescriptions  Medication Sig Dispense Refill  . ADCIRCA 20 MG tablet TAKE TWO  TABLETS (40MG ) BY MOUTH ONCE DAILY. CALL [phone] TO REFILL. 180 tablet 2  . aspirin 81 MG tablet Take 81 mg by mouth daily.      . Calcium-Vitamin D-Vitamin K 500-500-40 MG-UNT-MCG CHEW  Chew by mouth 2 (two) times daily.    Marland Kitchen dextromethorphan-guaiFENesin (MUCINEX DM) 30-600 MG 12hr tablet Take 1 tablet by mouth 2 (two) times daily.    Marland Kitchen diltiazem (CARDIZEM CD) 240 MG 24 hr capsule TAKE 1 CAPSULE EVERY DAY 90 capsule 3  . furosemide (LASIX) 20 MG tablet TAKE 1 TABLET (20 MG TOTAL) BY MOUTH DAILY. 90 tablet 0  . KLOR-CON M10 10 MEQ tablet TAKE ONE TABLET BY MOUTH ONCE DAILY 90 tablet 1  . lansoprazole (PREVACID) 30 MG capsule Take 30 mg by mouth 2 (two) times daily.      Marland Kitchen loratadine (CLARITIN) 10 MG tablet Take 10 mg by mouth daily.      . naproxen (NAPROSYN) 500 MG tablet Take 500 mg by mouth as needed.     . OPSUMIT 10 MG TABS TAKE ONE TABLET (10MG ) BY MOUTH DAILY. DO NOT HANDLE IF PREGNANT. DO NOT SPLIT, CRUSH, OR CHEW. REVIEW MEDICATION GUIDE. CALL [phone] 90 tablet 2  . rosuvastatin (CRESTOR) 5 MG tablet Take 5 mg by mouth daily.     . vitamin E (VITAMIN E) 400 UNIT capsule Take 400 Units by mouth daily.       No current facility-administered medications for this encounter.      PHYSICAL EXAM: Vitals:   11/22/16 1340  BP: 136/70  Pulse: 90  SpO2: 100%  Weight: 179 lb 8 oz (81.4 kg)    General:  Well appearing. NAD. Husband present.  HEENT: Normal effort Neck: supple. JVP ~6 cm. Carotids 2+ bilaterally; no bruits. No thyromegaly or nodule noted.  Cor: PMI normal. RRR. No rubs, S3/S4. 2/6 TR. P2 slightly increased.  Lungs: CTAB, normal effort Abdomen: soft, NT, ND, no HSM. No bruits or masses. +BS  Extremities: no cyanosis, clubbing, rash, 1+ ankle edema. Hyperemia and blanching in hands + telangectasias Neuro: Alert & orientedx3, cranial nerves grossly intact. Moves all 4 extremities w/o difficulty. Affect pleasant.  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 CREST syndrome  3. HTN 4. Diastolic HF  Amy Clegg NP-C  11/22/2016   Patient seen and examined with Tonye Becket, NP. We discussed all aspects of the  encounter. I agree with the assessment and plan as stated above.   Long discussion today about need for possible addition of selexapeg as 3rd agent for PAH. Overall NYHA II-early III. Echo reviewed with her and RV is normal. stable today.   Overall I suspect she is still NYHA II but may be getting close to NYHA III (at which point I would add selexepag). She would like to try Pulmonary Rehab at [facility] first to see how she does. If not improving would have  low threshold to add 3rd agent.   Total time spent 40 minutes. Over half that time spent discussing above.   Arvilla Meres, MD  10:58 PM

## 2016-11-22 NOTE — Telephone Encounter (Deleted)
Mrs. Olivia Washington has been approved for Opsumit 10 mg tablets through 06/04/2017 under her Medicare Part D benefit.   Marlinda Mikelaudia Ortiz Lopez PharmD Candidate

## 2016-11-23 ENCOUNTER — Telehealth (HOSPITAL_COMMUNITY): Payer: Self-pay | Admitting: *Deleted

## 2016-11-23 NOTE — Telephone Encounter (Signed)
Referral for pfts and pulm rehab along with pt's records [facility] at [phone]

## 2017-01-07 ENCOUNTER — Other Ambulatory Visit (HOSPITAL_COMMUNITY): Payer: Self-pay | Admitting: Internal Medicine

## 2017-01-20 ENCOUNTER — Other Ambulatory Visit: Payer: Self-pay | Admitting: Internal Medicine

## 2017-03-28 ENCOUNTER — Other Ambulatory Visit (HOSPITAL_COMMUNITY): Payer: Self-pay | Admitting: Internal Medicine

## 2017-04-03 ENCOUNTER — Other Ambulatory Visit (HOSPITAL_COMMUNITY): Payer: Self-pay | Admitting: Internal Medicine

## 2017-04-04 ENCOUNTER — Encounter (HOSPITAL_COMMUNITY): Payer: Self-pay | Admitting: Internal Medicine

## 2017-04-04 ENCOUNTER — Ambulatory Visit (HOSPITAL_COMMUNITY)
Admission: RE | Admit: 2017-04-04 | Discharge: 2017-04-04 | Disposition: A | Discharge status: Home / Self Care | Payer: Medicare Other | Source: Ambulatory Visit | Attending: Internal Medicine | Admitting: Internal Medicine

## 2017-04-04 ENCOUNTER — Other Ambulatory Visit (HOSPITAL_COMMUNITY): Payer: Self-pay | Admitting: *Deleted

## 2017-04-04 ENCOUNTER — Encounter (HOSPITAL_COMMUNITY): Payer: Self-pay | Admitting: *Deleted

## 2017-04-04 VITALS — BP 142/68 | HR 81 | Wt 176.1 lb

## 2017-04-04 DIAGNOSIS — I110 Hypertensive heart disease with heart failure: Secondary | ICD-10-CM | POA: Diagnosis not present

## 2017-04-04 DIAGNOSIS — Z7982 Long term (current) use of aspirin: Secondary | ICD-10-CM | POA: Insufficient documentation

## 2017-04-04 DIAGNOSIS — M341 CR(E)ST syndrome: Secondary | ICD-10-CM | POA: Insufficient documentation

## 2017-04-04 DIAGNOSIS — K219 Gastro-esophageal reflux disease without esophagitis: Secondary | ICD-10-CM | POA: Diagnosis not present

## 2017-04-04 DIAGNOSIS — Z79899 Other long term (current) drug therapy: Secondary | ICD-10-CM | POA: Insufficient documentation

## 2017-04-04 DIAGNOSIS — E785 Hyperlipidemia, unspecified: Secondary | ICD-10-CM | POA: Insufficient documentation

## 2017-04-04 DIAGNOSIS — I10 Essential (primary) hypertension: Secondary | ICD-10-CM | POA: Diagnosis not present

## 2017-04-04 DIAGNOSIS — I5030 Unspecified diastolic (congestive) heart failure: Secondary | ICD-10-CM | POA: Insufficient documentation

## 2017-04-04 DIAGNOSIS — I5032 Chronic diastolic (congestive) heart failure: Secondary | ICD-10-CM | POA: Diagnosis not present

## 2017-04-04 DIAGNOSIS — I272 Pulmonary hypertension, unspecified: Secondary | ICD-10-CM

## 2017-04-04 DIAGNOSIS — M1990 Unspecified osteoarthritis, unspecified site: Secondary | ICD-10-CM | POA: Insufficient documentation

## 2017-04-04 DIAGNOSIS — I2721 Secondary pulmonary arterial hypertension: Secondary | ICD-10-CM | POA: Diagnosis present

## 2017-04-04 MED ORDER — SPIRONOLACTONE 25 MG PO TABS: 12.5000 mg | ORAL_TABLET | Freq: Every day | ORAL | 3 refills | Status: DC

## 2017-04-04 NOTE — Progress Notes (Signed)
Patient ID: Olivia Washington, female   DOB: 12-11-1945, 71 y.o.   MRN: [id number]  Advanced Heart Failure Clinic Note  Primary Care: Dr. Terrill Mohr [facility])  Rheumatologist: Dr. Dareen Piano => Dr. Dierdre Forth ENT: Dr. Annalee Genta   HPI:  Olivia Washington is a 72 y.o. 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 Non-smoker.   At last visit DLCO was down so we did hi-res CT. No ILD. Mosaic pattern c/w pHTN  Today she returns for [facility] follow up. Feeling great. Doing Pulmonary Rehab at [facility]. Feeling great. Walking 30 mins at 2.1 mph at 2% grade without problem. Uses 2L [place]. Also doing elliptical and sationnary bike for total 47 mins. Edema well controlled. Takes lasix daily. Rarely will take extra. No dizziness or syncope. Remains on Adcirca 40 and macitentan 10.   Echo 4/18 EF 60% RV normal IVC normal RVSP  Echo 12/29/15 EF 55-60% RV normal. Trivial TR RVSP 35-40 IVC normal    PFTs 05/25/16  FVC 3.17 (99%) FEV 2.30 (95%) FEV1/FVC 72 TLCO 4.01 (77%) DLCO 9.65 (37%)  PH Work up:  TTE 05/13/12: LVEF 55-60%. Grade 1 diastolic dysfunction. Ventricular septum with diastolic flattening. LA mildly dilated. RV mildly dilated with systolic function mod reduced. RA mildly dilated. Atrial septum bowed right to left. PAPP 54 mmHg.  CT scan chest 12/3/130 showed no ILD, no acute or chronic PE but some dilation of the PA's. Left thyroid enlargement with rightward tracheal deviation (ENT eval pending)  VQ scan 05/27/12: normal, no PE  RHC 05/24/12  RA: 11/8/6  RV: 86/7  PA: 78/27 (47) PCWP: 8/8/5   Cardiac Output  Thermodilution: 3.6 with index of 1.9  Fick : 5.9 with index of 3.1  11.6 woods units  At final adenosine dose  76/28 (46)  CO (thermo) = 4.3 with index of 2.3  9.5 Woods units  LHC: normal cors  PFTs and sleep study completed  07/03/12 Sleep study 1. Small numbers of  obstructive events which do not meet the AHI  criteria for the obstructive sleep apnea syndrome.  2. Transient REM associated desaturation as low as 82% during the  night. However, the patient only spent 23 minutes the entire night  less than 88%  ECHO 5/14: Normal LV. RV mildly dilated with normal systolic function, peak PA 54 Echo 09/23/13: EF 65-70% RV normal. RVSP Echo 4/16: EF 55-60%, mild LVH, normal RV size and systolic function, PA systolic pressure 39 mmHg.  Echo 7/17 EF 55-60% normal RV  RVSP  06/22/12 1150 ft, O2 sat did drop to 82% on RA and pt was placed on 2L O2 via [place] and O2 sat improved to 92%.  11/14: 6 min walk test completed, pt ambulated 1110 ft (338 m), O2 sat ranged from 89-99 % on 2L, HR ranged from 84-113 1160 ft on O2 6L 09/23/13 1000 ft sats 88% 2L. (different person walking with her)  7/15  1350 feet 87-100%  4/16 1200 feet, 366 m 8/16 1340 feet, 408 m  7/17  1480 feet 465m on 2L O2 sat nadir at 88%  09/27/16 1240 ft (378 m) on 2L O2 sats ranged 85-96%, HR ranged 98-123. 6 MW 11/22/16 1220 ft (361m)  ROS: All systems negative except as listed in HPI, PMH and Problem List.  Past Medical History:  Diagnosis Date  . Allergy history unknown   .  CREST (calcinosis, Raynaud's phenomenon, esophageal dysfunction, sclerodactyly, telangiectasia) (HCC)   . CREST syndrome (HCC)   . GERD (gastroesophageal reflux disease)   . Heart palpitations   . Hyperlipidemia   . Hypertension   . Hypertension   . Migraine   . Osteoarthrosis    unspecified whether generalized or localized  . Pulmonary hypertension (HCC)   . SOB (shortness of breath)   . Thyroid nodule     Current Outpatient Prescriptions  Medication Sig Dispense Refill  . ADCIRCA 20 MG tablet TAKE TWO  TABLETS (40MG ) BY MOUTH ONCE DAILY. CALL [phone] TO REFILL. 180 tablet 2  . aspirin 81 MG tablet Take 81 mg by mouth daily.      . Calcium-Vitamin D-Vitamin K  500-500-40 MG-UNT-MCG CHEW Chew by mouth 2 (two) times daily.    Marland Kitchen. dextromethorphan-guaiFENesin (MUCINEX DM) 30-600 MG 12hr tablet Take 1 tablet by mouth 2 (two) times daily.    Marland Kitchen. diltiazem (CARDIZEM CD) 240 MG 24 hr capsule TAKE 1 CAPSULE EVERY DAY 90 capsule 3  . furosemide (LASIX) 20 MG tablet TAKE 1 TABLET (20 MG TOTAL) BY MOUTH DAILY. 90 tablet 1  . KLOR-CON M10 10 MEQ tablet TAKE ONE TABLET BY MOUTH ONCE DAILY 90 tablet 1  . lansoprazole (PREVACID) 30 MG capsule Take 30 mg by mouth 2 (two) times daily.      Marland Kitchen. loratadine (CLARITIN) 10 MG tablet Take 10 mg by mouth daily.      . naproxen (NAPROSYN) 500 MG tablet Take 500 mg by mouth as needed.     . OPSUMIT 10 MG TABS TAKE 1 TABLET (10MG ) BY MOUTH DAILY. DO NOT HANDLE IF PREGNANT. DO NOTSPLIT, CRUSH, OR CHEW. REVIEW MEDICATION GUIDE. CALL [phone] FOR 90 tablet 2  . rosuvastatin (CRESTOR) 5 MG tablet Take 5 mg by mouth daily.     . vitamin E (VITAMIN E) 400 UNIT capsule Take 400 Units by mouth daily.       No current facility-administered medications for this encounter.      PHYSICAL EXAM: Vitals:   04/04/17 1048  BP: (!) 142/68  Pulse: 81  SpO2: 98%  Weight: 176 lb 1.9 oz (79.9 kg)   General:  Well appearing. No resp difficulty HEENT: normal Neck: supple. JVP 6. Carotids 2+ bilat; no bruits. No lymphadenopathy or thryomegaly appreciated. Cor: PMI nondisplaced. Regular rate & rhythm. Slightly increased P2. 2/6 TR Lungs: clear Abdomen: soft, nontender, nondistended. No hepatosplenomegaly. No bruits or masses. Good bowel sounds. Extremities: no cyanosis, clubbing, rash, 1+ edema Neuro: alert & orientedx3, cranial nerves grossly intact. moves all 4 extremities w/o difficulty. 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 CREST syndrome  3. HTN 4. Diastolic HF  Much improved with pulmonary rehab. NYHA II. RV looks of on echo. Will cotninue combination therapy for now and  not add selexapeg just yet. Will repeat RHC in November to further assess response. Continue exercise as I think it has helped greatly.  Reinforced need for daily weights and reviewed use of sliding scale diuretics. Add spiro 12.5 mg daily for HTN      Jais Demir, Reuel Boomaniel, MD  11:05 AM

## 2017-04-04 NOTE — Patient Instructions (Signed)
Start Spironolactone 12.5 mg (1/2 tab) daily  Labs in 1 week  Heart Catheterization on 11/30 see instruction sheet  We will contact you in 4 months to schedule your next appointment.

## 2017-04-04 NOTE — Addendum Note (Signed)
Encounter addended by: Noralee SpaceSchub, Galdino Hinchman M, RN on: 04/04/2017 11:35 AM<BR>    Actions taken: Order list changed, Sign clinical note

## 2017-04-04 NOTE — H&P (View-Only) (Signed)
Patient ID: Noralee Dutko, female   DOB: 12-11-1945, 71 y.o.   MRN: [id number]  Advanced Heart Failure Clinic Note  Primary Care: Dr. Terrill Mohr [facility])  Rheumatologist: Dr. Dareen Piano => Dr. Dierdre Forth ENT: Dr. Annalee Genta   HPI:  [name] is a 71 y.o. 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 Non-smoker.   At last visit DLCO was down so we did hi-res CT. No ILD. Mosaic pattern c/w pHTN  Today she returns for [facility] follow up. Feeling great. Doing Pulmonary Rehab at [facility]. Feeling great. Walking 30 mins at 2.1 mph at 2% grade without problem. Uses 2L [place]. Also doing elliptical and sationnary bike for total 47 mins. Edema well controlled. Takes lasix daily. Rarely will take extra. No dizziness or syncope. Remains on Adcirca 40 and macitentan 10.   Echo 4/18 EF 60% RV normal IVC normal RVSP  Echo 12/29/15 EF 55-60% RV normal. Trivial TR RVSP 35-40 IVC normal    PFTs 05/25/16  FVC 3.17 (99%) FEV 2.30 (95%) FEV1/FVC 72 TLCO 4.01 (77%) DLCO 9.65 (37%)  PH Work up:  TTE 05/13/12: LVEF 55-60%. Grade 1 diastolic dysfunction. Ventricular septum with diastolic flattening. LA mildly dilated. RV mildly dilated with systolic function mod reduced. RA mildly dilated. Atrial septum bowed right to left. PAPP 54 mmHg.  CT scan chest 12/3/130 showed no ILD, no acute or chronic PE but some dilation of the PA's. Left thyroid enlargement with rightward tracheal deviation (ENT eval pending)  VQ scan 05/27/12: normal, no PE  RHC 05/24/12  RA: 11/8/6  RV: 86/7  PA: 78/27 (47) PCWP: 8/8/5   Cardiac Output  Thermodilution: 3.6 with index of 1.9  Fick : 5.9 with index of 3.1  11.6 woods units  At final adenosine dose  76/28 (46)  CO (thermo) = 4.3 with index of 2.3  9.5 Woods units  LHC: normal cors  PFTs and sleep study completed  07/03/12 Sleep study 1. Small numbers of  obstructive events which do not meet the AHI  criteria for the obstructive sleep apnea syndrome.  2. Transient REM associated desaturation as low as 82% during the  night. However, the patient only spent 23 minutes the entire night  less than 88%  ECHO 5/14: Normal LV. RV mildly dilated with normal systolic function, peak PA 54 Echo 09/23/13: EF 65-70% RV normal. RVSP Echo 4/16: EF 55-60%, mild LVH, normal RV size and systolic function, PA systolic pressure 39 mmHg.  Echo 7/17 EF 55-60% normal RV  RVSP  06/22/12 1150 ft, O2 sat did drop to 82% on RA and pt was placed on 2L O2 via [place] and O2 sat improved to 92%.  11/14: 6 min walk test completed, pt ambulated 1110 ft (338 m), O2 sat ranged from 89-99 % on 2L, HR ranged from 84-113 1160 ft on O2 6L 09/23/13 1000 ft sats 88% 2L. (different person walking with her)  7/15  1350 feet 87-100%  4/16 1200 feet, 366 m 8/16 1340 feet, 408 m  7/17  1480 feet 465m on 2L O2 sat nadir at 88%  09/27/16 1240 ft (378 m) on 2L O2 sats ranged 85-96%, HR ranged 98-123. 6 MW 11/22/16 1220 ft (361m)  ROS: All systems negative except as listed in HPI, PMH and Problem List.  Past Medical History:  Diagnosis Date  . Allergy history unknown   .  CREST (calcinosis, Raynaud's phenomenon, esophageal dysfunction, sclerodactyly, telangiectasia) (HCC)   . CREST syndrome (HCC)   . GERD (gastroesophageal reflux disease)   . Heart palpitations   . Hyperlipidemia   . Hypertension   . Hypertension   . Migraine   . Osteoarthrosis    unspecified whether generalized or localized  . Pulmonary hypertension (HCC)   . SOB (shortness of breath)   . Thyroid nodule     Current Outpatient Prescriptions  Medication Sig Dispense Refill  . ADCIRCA 20 MG tablet TAKE TWO  TABLETS (40MG ) BY MOUTH ONCE DAILY. CALL [phone] TO REFILL. 180 tablet 2  . aspirin 81 MG tablet Take 81 mg by mouth daily.      . Calcium-Vitamin D-Vitamin K  500-500-40 MG-UNT-MCG CHEW Chew by mouth 2 (two) times daily.    Marland Kitchen. dextromethorphan-guaiFENesin (MUCINEX DM) 30-600 MG 12hr tablet Take 1 tablet by mouth 2 (two) times daily.    Marland Kitchen. diltiazem (CARDIZEM CD) 240 MG 24 hr capsule TAKE 1 CAPSULE EVERY DAY 90 capsule 3  . furosemide (LASIX) 20 MG tablet TAKE 1 TABLET (20 MG TOTAL) BY MOUTH DAILY. 90 tablet 1  . KLOR-CON M10 10 MEQ tablet TAKE ONE TABLET BY MOUTH ONCE DAILY 90 tablet 1  . lansoprazole (PREVACID) 30 MG capsule Take 30 mg by mouth 2 (two) times daily.      Marland Kitchen. loratadine (CLARITIN) 10 MG tablet Take 10 mg by mouth daily.      . naproxen (NAPROSYN) 500 MG tablet Take 500 mg by mouth as needed.     . OPSUMIT 10 MG TABS TAKE 1 TABLET (10MG ) BY MOUTH DAILY. DO NOT HANDLE IF PREGNANT. DO NOTSPLIT, CRUSH, OR CHEW. REVIEW MEDICATION GUIDE. CALL [phone] FOR 90 tablet 2  . rosuvastatin (CRESTOR) 5 MG tablet Take 5 mg by mouth daily.     . vitamin E (VITAMIN E) 400 UNIT capsule Take 400 Units by mouth daily.       No current facility-administered medications for this encounter.      PHYSICAL EXAM: Vitals:   04/04/17 1048  BP: (!) 142/68  Pulse: 81  SpO2: 98%  Weight: 176 lb 1.9 oz (79.9 kg)   General:  Well appearing. No resp difficulty HEENT: normal Neck: supple. JVP 6. Carotids 2+ bilat; no bruits. No lymphadenopathy or thryomegaly appreciated. Cor: PMI nondisplaced. Regular rate & rhythm. Slightly increased P2. 2/6 TR Lungs: clear Abdomen: soft, nontender, nondistended. No hepatosplenomegaly. No bruits or masses. Good bowel sounds. Extremities: no cyanosis, clubbing, rash, 1+ edema Neuro: alert & orientedx3, cranial nerves grossly intact. moves all 4 extremities w/o difficulty. 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 CREST syndrome  3. HTN 4. Diastolic HF  Much improved with pulmonary rehab. NYHA II. RV looks of on echo. Will cotninue combination therapy for now and  not add selexapeg just yet. Will repeat RHC in November to further assess response. Continue exercise as I think it has helped greatly.  Reinforced need for daily weights and reviewed use of sliding scale diuretics. Add spiro 12.5 mg daily for HTN      Serita Degroote, Reuel Boomaniel, MD  11:05 AM

## 2017-04-25 ENCOUNTER — Other Ambulatory Visit (HOSPITAL_COMMUNITY): Payer: Self-pay | Admitting: Internal Medicine

## 2017-05-04 ENCOUNTER — Encounter (HOSPITAL_COMMUNITY)
Admission: RE | Disposition: A | Discharge status: Home / Self Care | Payer: Self-pay | Source: Ambulatory Visit | Attending: Internal Medicine

## 2017-05-04 ENCOUNTER — Ambulatory Visit (HOSPITAL_COMMUNITY)
Admission: RE | Admit: 2017-05-04 | Discharge: 2017-05-04 | Disposition: A | Discharge status: Home / Self Care | Payer: Medicare Other | Source: Ambulatory Visit | Attending: Internal Medicine | Admitting: Internal Medicine

## 2017-05-04 DIAGNOSIS — G43909 Migraine, unspecified, not intractable, without status migrainosus: Secondary | ICD-10-CM | POA: Diagnosis not present

## 2017-05-04 DIAGNOSIS — I110 Hypertensive heart disease with heart failure: Secondary | ICD-10-CM | POA: Insufficient documentation

## 2017-05-04 DIAGNOSIS — M1990 Unspecified osteoarthritis, unspecified site: Secondary | ICD-10-CM | POA: Diagnosis not present

## 2017-05-04 DIAGNOSIS — Z7982 Long term (current) use of aspirin: Secondary | ICD-10-CM | POA: Insufficient documentation

## 2017-05-04 DIAGNOSIS — I5030 Unspecified diastolic (congestive) heart failure: Secondary | ICD-10-CM | POA: Diagnosis not present

## 2017-05-04 DIAGNOSIS — I2721 Secondary pulmonary arterial hypertension: Secondary | ICD-10-CM | POA: Insufficient documentation

## 2017-05-04 DIAGNOSIS — M341 CR(E)ST syndrome: Secondary | ICD-10-CM | POA: Insufficient documentation

## 2017-05-04 DIAGNOSIS — E785 Hyperlipidemia, unspecified: Secondary | ICD-10-CM | POA: Diagnosis not present

## 2017-05-04 DIAGNOSIS — I272 Pulmonary hypertension, unspecified: Secondary | ICD-10-CM

## 2017-05-04 DIAGNOSIS — K219 Gastro-esophageal reflux disease without esophagitis: Secondary | ICD-10-CM | POA: Insufficient documentation

## 2017-05-04 DIAGNOSIS — I7300 Raynaud's syndrome without gangrene: Secondary | ICD-10-CM | POA: Insufficient documentation

## 2017-05-04 HISTORY — PX: RIGHT HEART CATH: CATH118263

## 2017-05-04 LAB — CBC
HCT: 32.1 % — ABNORMAL LOW (ref 36.0–46.0)
Hemoglobin: 9.4 g/dL — ABNORMAL LOW (ref 12.0–15.0)
MCH: 22.9 pg — ABNORMAL LOW (ref 26.0–34.0)
MCHC: 29.3 g/dL — ABNORMAL LOW (ref 30.0–36.0)
MCV: 78.1 fL (ref 78.0–100.0)
PLATELETS: 228 10*3/uL (ref 150–400)
RBC: 4.11 MIL/uL (ref 3.87–5.11)
RDW: 16.9 % — AB (ref 11.5–15.5)
WBC: 5.1 10*3/uL (ref 4.0–10.5)

## 2017-05-04 LAB — BASIC METABOLIC PANEL
Anion gap: 7 (ref 5–15)
BUN: 15 mg/dL (ref 6–20)
CALCIUM: 9.5 mg/dL (ref 8.9–10.3)
CO2: 24 mmol/L (ref 22–32)
CREATININE: 0.7 mg/dL (ref 0.44–1.00)
Chloride: 105 mmol/L (ref 101–111)
GFR calc Af Amer: >60 mL/min (ref >60)
Glucose, Bld: 99 mg/dL (ref 65–99)
Potassium: 3.6 mmol/L (ref 3.5–5.1)
SODIUM: 136 mmol/L (ref 135–145)

## 2017-05-04 LAB — POCT I-STAT 3, VENOUS BLOOD GAS (G3P V)
ACID-BASE DEFICIT: 1 mmol/L (ref 0.0–2.0)
ACID-BASE DEFICIT: 2 mmol/L (ref 0.0–2.0)
Acid-base deficit: 1 mmol/L (ref 0.0–2.0)
BICARBONATE: 23.3 mmol/L (ref 20.0–28.0)
Bicarbonate: 22.3 mmol/L (ref 20.0–28.0)
Bicarbonate: 24 mmol/L (ref 20.0–28.0)
O2 SAT: 82 %
O2 Saturation: 80 %
O2 Saturation: 80 %
PH VEN: 7.397 (ref 7.250–7.430)
TCO2: 24 mmol/L (ref 22–32)
TCO2: 23 mmol/L (ref 22–32)
TCO2: 25 mmol/L (ref 22–32)
pCO2, Ven: 37.3 mmHg — ABNORMAL LOW (ref 44.0–60.0)
pCO2, Ven: 38.3 mmHg — ABNORMAL LOW (ref 44.0–60.0)
pCO2, Ven: 39.1 mmHg — ABNORMAL LOW (ref 44.0–60.0)
pH, Ven: 7.386 (ref 7.250–7.430)
pH, Ven: 7.392 (ref 7.250–7.430)
pO2, Ven: 44 mmHg (ref 32.0–45.0)
pO2, Ven: 44 mmHg (ref 32.0–45.0)
pO2, Ven: 46 mmHg — ABNORMAL HIGH (ref 32.0–45.0)

## 2017-05-04 LAB — PROTIME-INR
INR: 0.93
PROTHROMBIN TIME: 12.4 s (ref 11.4–15.2)

## 2017-05-04 SURGERY — RIGHT HEART CATH
Anesthesia: LOCAL

## 2017-05-04 MED ORDER — SODIUM CHLORIDE 0.9% FLUSH: 3.0000 mL | INTRAVENOUS | Status: DC | PRN

## 2017-05-04 MED ORDER — LIDOCAINE HCL (PF) 1 % IJ SOLN
INTRAMUSCULAR | Status: DC | PRN
  Administered 2017-05-04: 2 mL

## 2017-05-04 MED ORDER — ONDANSETRON HCL 4 MG/2ML IJ SOLN: 4.0000 mg | Freq: Four times a day (QID) | INTRAMUSCULAR | Status: DC | PRN

## 2017-05-04 MED ORDER — HEPARIN (PORCINE) IN NACL 2-0.9 UNIT/ML-% IJ SOLN
INTRAMUSCULAR | Status: AC | PRN
  Administered 2017-05-04: 500 mL

## 2017-05-04 MED ORDER — ACETAMINOPHEN 325 MG PO TABS: 650.0000 mg | ORAL_TABLET | ORAL | Status: DC | PRN

## 2017-05-04 MED ORDER — SODIUM CHLORIDE 0.9% FLUSH
3.0000 mL | INTRAVENOUS | Status: DC | PRN
Start: 2017-05-04 — End: 2017-05-04

## 2017-05-04 MED ORDER — LIDOCAINE HCL (PF) 1 % IJ SOLN
INTRAMUSCULAR | Status: AC
  Filled 2017-05-04: qty 30

## 2017-05-04 MED ORDER — SODIUM CHLORIDE 0.9% FLUSH: 3.0000 mL | Freq: Two times a day (BID) | INTRAVENOUS | Status: DC

## 2017-05-04 MED ORDER — SODIUM CHLORIDE 0.9 % IV SOLN: 250.0000 mL | INTRAVENOUS | Status: DC | PRN

## 2017-05-04 MED ORDER — SODIUM CHLORIDE 0.9 % IV SOLN
INTRAVENOUS | Status: DC
Start: 2017-05-04 — End: 2017-05-04
  Administered 2017-05-04: 14:00:00 via INTRAVENOUS

## 2017-05-04 MED ORDER — ASPIRIN 81 MG PO CHEW
CHEWABLE_TABLET | ORAL | Status: AC
  Administered 2017-05-04: 81 mg via ORAL
  Filled 2017-05-04: qty 1

## 2017-05-04 MED ORDER — ASPIRIN 81 MG PO CHEW
81.0000 mg | CHEWABLE_TABLET | ORAL | Status: AC
  Administered 2017-05-04: 81 mg via ORAL

## 2017-05-04 MED ORDER — HEPARIN (PORCINE) IN NACL 2-0.9 UNIT/ML-% IJ SOLN
INTRAMUSCULAR | Status: AC
  Filled 2017-05-04: qty 500

## 2017-05-04 SURGICAL SUPPLY — 7 items
CATH BALLN WEDGE 5F 110CM (CATHETERS) ×2 IMPLANT
KIT HEART LEFT (KITS) ×2 IMPLANT
PACK CARDIAC CATHETERIZATION (CUSTOM PROCEDURE TRAY) ×2 IMPLANT
SHEATH GLIDE SLENDER 4/5FR (SHEATH) ×2 IMPLANT
TRANSDUCER W/STOPCOCK (MISCELLANEOUS) ×2 IMPLANT
TUBING ART PRESS 72  MALE/FEM (TUBING) ×1
TUBING ART PRESS 72 MALE/FEM (TUBING) ×1 IMPLANT

## 2017-05-04 NOTE — Discharge Instructions (Signed)
Brachial Site Care Refer to this sheet in the next few weeks. These instructions provide you with information about caring for yourself after your procedure. Your health care provider may also give you more specific instructions. Your treatment has been planned according to current medical practices, but problems sometimes occur. Call your health care provider if you have any problems or questions after your procedure. What can I expect after the procedure? After your procedure, it is typical to have the following:  Bruising at the brachial site that usually fades within 1-2 weeks.  Follow these instructions at home:  Take medicines only as directed by your health care provider.  You may shower 24 hours after the procedure or as directed by your health care provider. Remove the bandage (dressing) and gently wash the site with plain soap and water. Pat the area dry with a clean towel.   Do not take baths, swim, or use a hot tub until your health care provider approves.  Check your insertion site every day for redness, swelling, or drainage.  Do not apply powder or lotion to the site.  Do not lift over 10 lb (4.5 kg) for 5 days after your procedure or as directed by your health care provider.  Ask your health care provider when it is okay to: ? Return to work or school. ? Resume usual physical activities or sports. ? Resume sexual activity.  Do not drive home if you are discharged the same day as the procedure. Have someone else drive you.  You may drive 24 hours after the procedure unless otherwise instructed by your health care provider.  Do not operate machinery or power tools for 24 hours after the procedure.  If your procedure was done as an outpatient procedure, which means that you went home the same day as your procedure, a responsible adult should be with you for the first 24 hours after you arrive home.  Keep all follow-up visits as directed by your health care provider.  This is important. Contact a health care provider if:  You have a fever.  You have chills.  You have increased bleeding from the radial site. Hold pressure on the site. Get help right away if:  You have unusual pain at the brachial site.  You have redness, warmth, or swelling at the brachial site.  You have drainage (other than a small amount of blood on the dressing) from the brachial site. This information is not intended to replace advice given to you by your health care provider. Make sure you discuss any questions you have with your health care provider. Document Released: 06/24/2010 Document Revised: 10/28/2015 Document Reviewed: 12/08/2013 Elsevier Interactive Patient Education  2018 [employer]

## 2017-05-04 NOTE — Interval H&P Note (Signed)
History and Physical Interval Note:  05/04/2017 3:15 PM  Olivia Washington  has presented today for surgery, with the diagnosis of PAH  The various methods of treatment have been discussed with the patient and family. After consideration of risks, benefits and other options for treatment, the patient has consented to  Procedure(s): RIGHT HEART CATH (N/A) as a surgical intervention .  The patient's history has been reviewed, patient examined, no change in status, stable for surgery.  I have reviewed the patient's chart and labs.  Questions were answered to the patient's satisfaction.     Olivia Washington

## 2017-05-07 ENCOUNTER — Encounter (HOSPITAL_COMMUNITY): Payer: Self-pay | Admitting: Internal Medicine

## 2017-05-09 ENCOUNTER — Telehealth (HOSPITAL_COMMUNITY): Payer: Self-pay | Admitting: Pharmacist

## 2017-05-09 NOTE — Telephone Encounter (Signed)
Tadalafil PA approved by [employer] through 11/06/17.   Tyler DeisErika K. Bonnye FavaNicolsen, PharmD, BCPS, CPP Clinical Pharmacist Pager: [phone] Phone: [phone] 05/09/2017 9:10 AM

## 2017-05-09 NOTE — Telephone Encounter (Signed)
Opsumit PA approved by [employer] through 06/04/18.   Tyler DeisErika K. Bonnye FavaNicolsen, PharmD, BCPS, CPP Clinical Pharmacist Pager: [phone] Phone: [phone] 05/09/2017 10:00 AM

## 2017-07-02 ENCOUNTER — Other Ambulatory Visit (HOSPITAL_COMMUNITY): Payer: Self-pay | Admitting: *Deleted

## 2017-07-02 MED ORDER — POTASSIUM CHLORIDE CRYS ER 10 MEQ PO TBCR: 10.0000 meq | EXTENDED_RELEASE_TABLET | Freq: Every day | ORAL | 1 refills | Status: DC

## 2017-07-16 ENCOUNTER — Other Ambulatory Visit (HOSPITAL_COMMUNITY): Payer: Self-pay | Admitting: Internal Medicine

## 2017-07-27 ENCOUNTER — Other Ambulatory Visit (HOSPITAL_COMMUNITY): Payer: Self-pay | Admitting: Pharmacist

## 2017-07-27 MED ORDER — TADALAFIL (PAH) 20 MG PO TABS: 40.0000 mg | ORAL_TABLET | Freq: Every day | ORAL | 3 refills | Status: DC

## 2017-08-28 ENCOUNTER — Encounter (HOSPITAL_COMMUNITY): Payer: Self-pay | Admitting: Internal Medicine

## 2017-09-03 ENCOUNTER — Other Ambulatory Visit: Payer: Self-pay | Admitting: Cardiology

## 2017-10-08 ENCOUNTER — Other Ambulatory Visit: Payer: Self-pay

## 2017-10-08 ENCOUNTER — Encounter (HOSPITAL_COMMUNITY): Payer: Self-pay | Admitting: Internal Medicine

## 2017-10-08 ENCOUNTER — Ambulatory Visit (HOSPITAL_COMMUNITY)
Admission: RE | Admit: 2017-10-08 | Discharge: 2017-10-08 | Disposition: A | Discharge status: Home / Self Care | Payer: Medicare Other | Source: Ambulatory Visit | Attending: Internal Medicine | Admitting: Internal Medicine

## 2017-10-08 VITALS — BP 139/59 | HR 87 | Wt 173.4 lb

## 2017-10-08 DIAGNOSIS — K219 Gastro-esophageal reflux disease without esophagitis: Secondary | ICD-10-CM | POA: Diagnosis not present

## 2017-10-08 DIAGNOSIS — I2721 Secondary pulmonary arterial hypertension: Secondary | ICD-10-CM | POA: Diagnosis present

## 2017-10-08 DIAGNOSIS — I10 Essential (primary) hypertension: Secondary | ICD-10-CM

## 2017-10-08 DIAGNOSIS — E785 Hyperlipidemia, unspecified: Secondary | ICD-10-CM | POA: Diagnosis not present

## 2017-10-08 DIAGNOSIS — I5030 Unspecified diastolic (congestive) heart failure: Secondary | ICD-10-CM | POA: Diagnosis not present

## 2017-10-08 DIAGNOSIS — D509 Iron deficiency anemia, unspecified: Secondary | ICD-10-CM | POA: Insufficient documentation

## 2017-10-08 DIAGNOSIS — I272 Pulmonary hypertension, unspecified: Secondary | ICD-10-CM | POA: Diagnosis not present

## 2017-10-08 DIAGNOSIS — Z79899 Other long term (current) drug therapy: Secondary | ICD-10-CM | POA: Diagnosis not present

## 2017-10-08 DIAGNOSIS — I110 Hypertensive heart disease with heart failure: Secondary | ICD-10-CM | POA: Diagnosis not present

## 2017-10-08 DIAGNOSIS — Z7982 Long term (current) use of aspirin: Secondary | ICD-10-CM | POA: Insufficient documentation

## 2017-10-08 DIAGNOSIS — M341 CR(E)ST syndrome: Secondary | ICD-10-CM | POA: Diagnosis not present

## 2017-10-08 NOTE — Progress Notes (Signed)
Patient ID: Olivia Washington, female   DOB: [birth year], 72 y.o.   MRN: [id number]  Advanced Heart Failure Clinic Note  Primary Care: Dr. Terrill Mohr [facility])  Rheumatologist: Dr. Dareen Piano => Dr. Dierdre Forth ENT: Dr. Annalee Genta   HPI:  Olivia Washington is a 72 y.o. 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 Non-smoker.   At last visit DLCO was down so we did hi-res CT. No ILD. Mosaic pattern c/w pHTN  Today she returns for [facility] follow up. Feeling great. Went on a 11-day cruise to [place]. Completed Pulmonary Rehab at [facility]. Has not been to the gym for several months. Uses 2L [place]. Edema generally well controlled. Takes lasix  daily. Rarely will take extra. No dizziness or syncope.  Remains on Adcirca 40 and macitentan 10.   Getting oral iron for IDA.   Echo 4/18 EF 60% RV normal IVC normal RVSP  Echo 12/29/15 EF 55-60% RV normal. Trivial TR RVSP 35-40 IVC normal    PFTs 05/25/16  FVC 3.17 (99%) FEV 2.30 (95%) FEV1/FVC 72 TLCO 4.01 (77%) DLCO 9.65 (37%)  PH Work up:  TTE 05/13/12: LVEF 55-60%. Grade 1 diastolic dysfunction. Ventricular septum with diastolic flattening. LA mildly dilated. RV mildly dilated with systolic function mod reduced. RA mildly dilated. Atrial septum bowed right to left. PAPP 54 mmHg.  CT scan chest 12/3/130 showed no ILD, no acute or chronic PE but some dilation of the PA's. Left thyroid enlargement with rightward tracheal deviation (ENT eval pending)  VQ scan 05/27/12: normal, no PE  RHC 05/24/12  RA: 11/8/6  RV: 86/7  PA: 78/27 (47) PCWP: 8/8/5   Cardiac Output  Thermodilution: 3.6 with index of 1.9  Fick : 5.9 with index of 3.1  11.6 woods units  At final adenosine dose  76/28 (46)  CO (thermo) = 4.3 with index of 2.3  9.5 Woods units  LHC: normal cors  PFTs and sleep study completed  07/03/12 Sleep study 1. Small numbers  of obstructive events which do not meet the AHI  criteria for the obstructive sleep apnea syndrome.  2. Transient REM associated desaturation as low as 82% during the  night. However, the patient only spent 23 minutes the entire night  less than 88%  ECHO 5/14: Normal LV. RV mildly dilated with normal systolic function, peak PA 54 Echo 09/23/13: EF 65-70% RV normal. RVSP Echo 4/16: EF 55-60%, mild LVH, normal RV size and systolic function, PA systolic pressure 39 mmHg.  Echo 7/17 EF 55-60% normal RV  RVSP  06/22/12 1150 ft, O2 sat did drop to 82% on RA and pt was placed on 2L O2 via [place] and O2 sat improved to 92%.  11/14: 6 min walk test completed, pt ambulated 1110 ft (338 m), O2 sat ranged from 89-99 % on 2L, HR ranged from 84-113 1160 ft on O2 6L 09/23/13 1000 ft sats 88% 2L. (different person walking with her)  7/15  1350 feet 87-100%  4/16 1200 feet, 366 m 8/16 1340 feet, 408 m  7/17  1480 feet 475m on 2L O2 sat nadir at 88%  09/27/16 1240 ft (378 m) on 2L O2 sats ranged 85-96%, HR ranged 98-123. 6 MW 11/22/16 1220 ft (375m)  ROS: All systems negative except as listed in HPI, PMH and Problem List.  Past Medical History:  Diagnosis Date  .  Allergy history unknown   . CREST (calcinosis, Raynaud's phenomenon, esophageal dysfunction, sclerodactyly, telangiectasia) (HCC)   . CREST syndrome (HCC)   . GERD (gastroesophageal reflux disease)   . Heart palpitations   . Hyperlipidemia   . Hypertension   . Hypertension   . Migraine   . Osteoarthrosis    unspecified whether generalized or localized  . Pulmonary hypertension (HCC)   . SOB (shortness of breath)   . Thyroid nodule     Current Outpatient Medications  Medication Sig Dispense Refill  . acetaminophen (TYLENOL) 650 MG CR tablet Take 1,300 mg by mouth every 8 (eight) hours as needed for pain.    Marland Kitchen aspirin 81 MG tablet Take 81 mg by mouth daily.      . Calcium-Vitamin D-Vitamin K  500-500-40 MG-UNT-MCG CHEW Chew by mouth 2 (two) times daily.    . Cranberry 1000 MG CAPS Take 1,000 mg by mouth daily.    Marland Kitchen diltiazem (CARDIZEM CD) 240 MG 24 hr capsule TAKE 1 CAPSULE EVERY DAY 90 capsule 3  . diphenhydrAMINE-Phenylephrine 6.25-2.5 MG/5ML LIQD Take by mouth.    . ferrous sulfate 325 (65 FE) MG tablet Take 325 mg by mouth daily with breakfast.    . furosemide (LASIX) 20 MG tablet TAKE 1 TABLET BY MOUTH EVERY DAY 90 tablet 0  . lansoprazole (PREVACID) 30 MG capsule Take 30 mg by mouth 2 (two) times daily.      . OPSUMIT 10 MG TABS TAKE 1 TABLET ( ) BY MOUTH DAILY. DO NOT HANDLE IF PREGNANT. DO NOTSPLIT, CRUSH, OR CHEW. REVIEW MEDICATION GUIDE. CALL [phone] FOR 90 tablet 2  . potassium chloride (KLOR-CON M10) 10 MEQ tablet Take 1 tablet (10 mEq total) by mouth daily. 90 tablet 1  . rosuvastatin (CRESTOR) 5 MG tablet Take 5 mg by mouth daily.     Marland Kitchen spironolactone (ALDACTONE) 25 MG tablet Take 0.5 tablets (12.5 mg total) by mouth daily. 45 tablet 3  . tadalafil, PAH, (ADCIRCA) 20 MG tablet Take 2 tablets (40 mg total) by mouth daily. 180 tablet 3  . vitamin E (VITAMIN E) 400 UNIT capsule Take 400 Units by mouth daily.       No current facility-administered medications for this encounter.      PHYSICAL EXAM: Vitals:   10/08/17 1109  BP: (!) 139/59  Pulse: 87  SpO2: 95%  Weight: 173 lb 6.4 oz (78.7 kg)   Filed Weights   10/08/17 1109  Weight: 173 lb 6.4 oz (78.7 kg)   General:  Well appearing. No resp difficulty HEENT: normal Neck: supple. JVP 6  Carotids 2+ bilat; no bruits. No lymphadenopathy or thryomegaly appreciated. Cor: PMI nondisplaced. Regular rate & rhythm. No rubs, gallops or murmurs. Lungs: clear Abdomen: soft, nontender, nondistended. No hepatosplenomegaly. No bruits or masses. Good bowel sounds. Extremities: no cyanosis, clubbing, rash, 1+ edema +scleryldactyly Neuro: alert & orientedx3, cranial nerves grossly intact. moves all 4 extremities w/o  difficulty. 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 CREST syndrome  3. HTN 4. Diastolic HF 5. Iron-deficient anemia  She improved greatly with Pulmonary Rehab and was able to avoid selexipag initiation. Continues to do well but hasn't exercised regularly in 4-5 months. Strongly encouraged her to get back to exercise. Will do today and order repeat echo. Fluid up slightly. Continue with lasix - take extra as needed.   Needs colonoscopy for further eval of IDA. OK to proceed  form cardiac standpoint.  Arvilla Meres, MD  11:50 AM

## 2017-10-08 NOTE — Progress Notes (Signed)
6 min walk test completed:  Pt ambulated 1290 ft (393 m).  On 2 L O2 sats ranged 87-96%, HR ranged 93-131.

## 2017-10-08 NOTE — Patient Instructions (Signed)
Your physician recommends that you schedule a follow-up appointment in: 4 months with echocardiogram  

## 2017-10-08 NOTE — Addendum Note (Signed)
Encounter addended by: Noralee Space, RN on: 10/08/2017 12:34 PM  Actions taken: Order list changed, Diagnosis association updated, Sign clinical note

## 2017-12-03 ENCOUNTER — Other Ambulatory Visit (HOSPITAL_COMMUNITY): Payer: Self-pay | Admitting: Internal Medicine

## 2017-12-11 ENCOUNTER — Other Ambulatory Visit (HOSPITAL_COMMUNITY): Payer: Self-pay | Admitting: Internal Medicine

## 2018-01-03 ENCOUNTER — Other Ambulatory Visit (HOSPITAL_COMMUNITY): Payer: Self-pay | Admitting: Internal Medicine

## 2018-01-09 ENCOUNTER — Other Ambulatory Visit (HOSPITAL_COMMUNITY): Payer: Self-pay | Admitting: *Deleted

## 2018-01-09 MED ORDER — MACITENTAN 10 MG PO TABS: ORAL_TABLET | ORAL | 3 refills | Status: DC

## 2018-02-08 ENCOUNTER — Ambulatory Visit (HOSPITAL_COMMUNITY)
Admission: RE | Admit: 2018-02-08 | Discharge: 2018-02-08 | Disposition: A | Discharge status: Home / Self Care | Payer: Medicare Other | Source: Ambulatory Visit | Attending: Internal Medicine | Admitting: Internal Medicine

## 2018-02-08 ENCOUNTER — Other Ambulatory Visit: Payer: Self-pay

## 2018-02-08 ENCOUNTER — Ambulatory Visit (HOSPITAL_BASED_OUTPATIENT_CLINIC_OR_DEPARTMENT_OTHER)
Admission: RE | Admit: 2018-02-08 | Discharge: 2018-02-08 | Disposition: A | Discharge status: Home / Self Care | Payer: Medicare Other | Source: Ambulatory Visit | Attending: Internal Medicine | Admitting: Internal Medicine

## 2018-02-08 VITALS — BP 122/52 | HR 86 | Wt 171.2 lb

## 2018-02-08 DIAGNOSIS — E785 Hyperlipidemia, unspecified: Secondary | ICD-10-CM | POA: Diagnosis not present

## 2018-02-08 DIAGNOSIS — K219 Gastro-esophageal reflux disease without esophagitis: Secondary | ICD-10-CM | POA: Insufficient documentation

## 2018-02-08 DIAGNOSIS — I272 Pulmonary hypertension, unspecified: Secondary | ICD-10-CM

## 2018-02-08 DIAGNOSIS — I313 Pericardial effusion (noninflammatory): Secondary | ICD-10-CM | POA: Insufficient documentation

## 2018-02-08 DIAGNOSIS — D509 Iron deficiency anemia, unspecified: Secondary | ICD-10-CM | POA: Insufficient documentation

## 2018-02-08 DIAGNOSIS — I509 Heart failure, unspecified: Secondary | ICD-10-CM | POA: Insufficient documentation

## 2018-02-08 DIAGNOSIS — I2721 Secondary pulmonary arterial hypertension: Secondary | ICD-10-CM | POA: Diagnosis not present

## 2018-02-08 DIAGNOSIS — Z09 Encounter for follow-up examination after completed treatment for conditions other than malignant neoplasm: Secondary | ICD-10-CM | POA: Diagnosis present

## 2018-02-08 DIAGNOSIS — Z79899 Other long term (current) drug therapy: Secondary | ICD-10-CM | POA: Diagnosis not present

## 2018-02-08 DIAGNOSIS — M341 CR(E)ST syndrome: Secondary | ICD-10-CM | POA: Insufficient documentation

## 2018-02-08 DIAGNOSIS — I081 Rheumatic disorders of both mitral and tricuspid valves: Secondary | ICD-10-CM | POA: Insufficient documentation

## 2018-02-08 DIAGNOSIS — M1990 Unspecified osteoarthritis, unspecified site: Secondary | ICD-10-CM | POA: Insufficient documentation

## 2018-02-08 DIAGNOSIS — Z7982 Long term (current) use of aspirin: Secondary | ICD-10-CM | POA: Diagnosis not present

## 2018-02-08 DIAGNOSIS — I110 Hypertensive heart disease with heart failure: Secondary | ICD-10-CM | POA: Insufficient documentation

## 2018-02-08 NOTE — Progress Notes (Signed)
Patient ID: Olivia Washington, female   DOB: September 19, 1945, 72 y.o.   MRN: [id number]  Advanced Heart Failure Clinic Note  Primary Care: Dr. Terrill Mohr [facility])  Rheumatologist: Dr. Dareen Piano => Dr. Dierdre Forth ENT: Dr. Annalee Genta   HPI:  Olivia Washington is a 72  y.o. 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 Non-smoker.   In 1/18 DLCO was down so we did hi-res CT. No ILD. Mosaic pattern c/w pHTN  Today she returns for [facility] follow up. Says she feels wonderful. Has not increased activity level though. Completed Pulmonary Rehab at [facility]. Uses 2L [place]. Edema controlled on lasix 20mg  daily. Rarely will take extra. No dizziness or syncope.  Remains on Adcirca 40 and macitentan 10. Has not wanted to start selexipag yet.   Echo EF 60% RV normal Trivial TR RVSP ~78mmHG. IVC small. Personally reviewed   Getting oral iron for IDA.   Echo 4/18 EF 60% RV normal IVC normal RVSP  Echo 12/29/15 EF 55-60% RV normal. Trivial TR RVSP 35-40 IVC normal    PFTs 05/25/16  FVC 3.17 (99%) FEV 2.30 (95%) FEV1/FVC 72 TLCO 4.01 (77%) DLCO 9.65 (37%)  PH Work up:  TTE 05/13/12: LVEF 55-60%. Grade 1 diastolic dysfunction. Ventricular septum with diastolic flattening. LA mildly dilated. RV mildly dilated with systolic function mod reduced. RA mildly dilated. Atrial septum bowed right to left. PAPP 54 mmHg.  CT scan chest 12/3/130 showed no ILD, no acute or chronic PE but some dilation of the PA's. Left thyroid enlargement with rightward tracheal deviation (ENT eval pending)  VQ scan 05/27/12: normal, no PE  RHC 05/24/12  RA: 11/8/6  RV: 86/7  PA: 78/27 (47) PCWP: 8/8/5   Cardiac Output  Thermodilution: 3.6 with index of 1.9  Fick : 5.9 with index of 3.1  11.6 woods units  At final adenosine dose  76/28 (46)  CO (thermo) = 4.3 with index of 2.3  9.5 Woods units  LHC: normal cors  PFTs  and sleep study completed  07/03/12 Sleep study 1. Small numbers of obstructive events which do not meet the AHI  criteria for the obstructive sleep apnea syndrome.  2. Transient REM associated desaturation as low as 82% during the  night. However, the patient only spent 23 minutes the entire night  less than 88%  ECHO 5/14: Normal LV. RV mildly dilated with normal systolic function, peak PA 54 Echo 09/23/13: EF 65-70% RV normal. RVSP Echo 4/16: EF 55-60%, mild LVH, normal RV size and systolic function, PA systolic pressure 39 mmHg.  Echo 7/17 EF 55-60% normal RV  RVSP  06/22/12 1150 ft, O2 sat did drop to 82% on RA and pt was placed on 2L O2 via [place] and O2 sat improved to 92%.  11/14: 6 min walk test completed, pt ambulated 1110 ft (338 m), O2 sat ranged from 89-99 % on 2L, HR ranged from 84-113 1160 ft on O2 6L 09/23/13 1000 ft sats 88% 2L. (different person walking with her)  7/15  1350 feet 87-100%  4/16 1200 feet, 366 m 8/16 1340 feet, 408 m  7/17  1480 feet 457m on 2L O2 sat nadir at 88%  09/27/16 1240 ft (378 m) on 2L O2 sats ranged 85-96%, HR ranged 98-123. 6 MW 11/22/16 1220 ft (341m) 6 MW 10/08/17 1290 ft (378m)  ROS: All systems  negative except as listed in HPI, PMH and Problem List.  Past Medical History:  Diagnosis Date  . Allergy history unknown   . CREST (calcinosis, Raynaud's phenomenon, esophageal dysfunction, sclerodactyly, telangiectasia) (HCC)   . CREST syndrome (HCC)   . GERD (gastroesophageal reflux disease)   . Heart palpitations   . Hyperlipidemia   . Hypertension   . Hypertension   . Migraine   . Osteoarthrosis    unspecified whether generalized or localized  . Pulmonary hypertension (HCC)   . SOB (shortness of breath)   . Thyroid nodule     Current Outpatient Medications  Medication Sig Dispense Refill  . acetaminophen (TYLENOL) 650 MG CR tablet Take 1,300 mg by mouth every 8 (eight) hours as needed for  pain.    Marland Kitchen aspirin 81 MG tablet Take 81 mg by mouth daily.      . Calcium-Vitamin D-Vitamin K 500-500-40 MG-UNT-MCG CHEW Chew by mouth 2 (two) times daily.    . Cranberry 1000 MG CAPS Take 1,000 mg by mouth daily.    Marland Kitchen diltiazem (CARDIZEM CD) 240 MG 24 hr capsule Take 1 capsule (240 mg total) by mouth daily. 30 capsule 11  . diphenhydrAMINE-Phenylephrine 6.25-2.5 MG/5ML LIQD Take by mouth.    . ferrous sulfate 325 (65 FE) MG tablet Take 325 mg by mouth daily with breakfast.    . furosemide (LASIX) 20 MG tablet TAKE 1 TABLET BY MOUTH EVERY DAY 90 tablet 0  . lansoprazole (PREVACID) 30 MG capsule Take 30 mg by mouth 2 (two) times daily.      . macitentan (OPSUMIT) 10 MG tablet TAKE 1 TABLET (10MG ) BY MOUTH DAILY. DO NOT HANDLE IF PREGNANT. DO NOTSPLIT, CRUSH, OR CHEW. REVIEW MEDICATION GUIDE. CALL [phone] FOR 90 tablet 3  . potassium chloride (KLOR-CON M10) 10 MEQ tablet Take 1 tablet (10 mEq total) by mouth daily. 90 tablet 1  . rosuvastatin (CRESTOR) 5 MG tablet Take 5 mg by mouth daily.     Marland Kitchen spironolactone (ALDACTONE) 25 MG tablet Take 0.5 tablets (12.5 mg total) by mouth daily. 45 tablet 3  . tadalafil, PAH, (ADCIRCA) 20 MG tablet Take 2 tablets (40 mg total) by mouth daily. 180 tablet 3  . vitamin E (VITAMIN E) 400 UNIT capsule Take 400 Units by mouth daily.       No current facility-administered medications for this encounter.      PHYSICAL EXAM: Vitals:   02/08/18 1357  BP: (!) 122/52  Pulse: 86  SpO2: 99%  Weight: 77.7 kg (171 lb 4 oz)   Filed Weights   02/08/18 1357  Weight: 77.7 kg (171 lb 4 oz)   General:  Well appearing. No resp difficulty On O2 HEENT: normal Neck: supple. no JVD. Carotids 2+ bilat; no bruits. No lymphadenopathy or thryomegaly appreciated. Cor: PMI nondisplaced. Regular rate & rhythm. 2/6 TR no RV lift  Lungs: clear Abdomen: soft, nontender, nondistended. No hepatosplenomegaly. No bruits or masses. Good bowel sounds. Extremities: no cyanosis,  clubbing, rash, trace edema Neuro: alert & orientedx3, cranial nerves grossly intact. moves all 4 extremities w/o difficulty. 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 CREST syndrome  3. HTN 4. Diastolic HF 5. Iron-deficient anemia - colonoscopy ok   Doing well NYHA II. Volume status looks good. stable. Echo reviewed personally and no evidence of RV strain or PAH. Continue current therapy. Encouraged her to be more active. See back in  6 months with repeat PFTS with DLCO.   Arvilla Meres, MD  2:08 PM

## 2018-02-08 NOTE — Patient Instructions (Signed)
Follow up with Pulmonary Function Test in 6 months.  Please call our clinic in February 2020 to schedule your appointments.  [phone], Option 3.

## 2018-02-08 NOTE — Progress Notes (Signed)
  Echocardiogram 2D Echocardiogram has been performed.  Roosvelt Maser F 02/08/2018, 2:04 PM

## 2018-02-24 ENCOUNTER — Other Ambulatory Visit (HOSPITAL_COMMUNITY): Payer: Self-pay | Admitting: Internal Medicine

## 2018-03-05 ENCOUNTER — Other Ambulatory Visit (HOSPITAL_COMMUNITY): Payer: Self-pay

## 2018-03-05 MED ORDER — POTASSIUM CHLORIDE CRYS ER 10 MEQ PO TBCR: 10.0000 meq | EXTENDED_RELEASE_TABLET | Freq: Every day | ORAL | 3 refills | Status: DC

## 2018-03-20 ENCOUNTER — Other Ambulatory Visit (HOSPITAL_COMMUNITY): Payer: Self-pay | Admitting: Internal Medicine

## 2018-05-08 ENCOUNTER — Other Ambulatory Visit (HOSPITAL_COMMUNITY): Payer: Self-pay | Admitting: Pharmacist

## 2018-05-08 MED ORDER — TADALAFIL (PAH) 20 MG PO TABS: 40.0000 mg | ORAL_TABLET | Freq: Every day | ORAL | 3 refills | Status: DC

## 2018-06-04 ENCOUNTER — Telehealth (HOSPITAL_COMMUNITY): Payer: Self-pay

## 2018-06-04 NOTE — Telephone Encounter (Signed)
PA submitted for renewal of opsumit and tadalafil

## 2018-06-04 NOTE — Telephone Encounter (Signed)
Prior authorization through [employer] insurance company was APPROVED for TADALAFIL and will expire on 06/05/2019.

## 2018-06-04 NOTE — Telephone Encounter (Signed)
Prior authorization through [employer] company was APPROVED for OPSUMIT and will expire on 06/05/2019.

## 2018-09-03 ENCOUNTER — Other Ambulatory Visit (HOSPITAL_COMMUNITY): Payer: Self-pay

## 2018-09-03 MED ORDER — SPIRONOLACTONE 25 MG PO TABS: ORAL_TABLET | ORAL | 3 refills | Status: DC

## 2018-09-03 NOTE — Telephone Encounter (Signed)
Refilled spironolactone

## 2018-09-16 ENCOUNTER — Other Ambulatory Visit (HOSPITAL_COMMUNITY): Payer: Self-pay | Admitting: *Deleted

## 2018-09-16 MED ORDER — SPIRONOLACTONE 25 MG PO TABS: ORAL_TABLET | ORAL | 3 refills | Status: DC

## 2018-09-16 MED ORDER — FUROSEMIDE 20 MG PO TABS: 20.0000 mg | ORAL_TABLET | Freq: Every day | ORAL | 3 refills | Status: DC

## 2018-09-18 ENCOUNTER — Encounter (HOSPITAL_COMMUNITY): Payer: Medicare Other

## 2018-09-18 ENCOUNTER — Encounter (HOSPITAL_COMMUNITY): Payer: Medicare Other | Admitting: Internal Medicine

## 2018-09-23 ENCOUNTER — Telehealth (HOSPITAL_COMMUNITY): Payer: Medicare Other | Admitting: Internal Medicine

## 2018-09-25 ENCOUNTER — Other Ambulatory Visit (HOSPITAL_COMMUNITY): Payer: Self-pay | Admitting: Internal Medicine

## 2018-11-11 ENCOUNTER — Other Ambulatory Visit (HOSPITAL_COMMUNITY): Payer: Self-pay | Admitting: *Deleted

## 2018-11-11 DIAGNOSIS — I272 Pulmonary hypertension, unspecified: Secondary | ICD-10-CM

## 2018-11-20 ENCOUNTER — Encounter (HOSPITAL_COMMUNITY): Payer: Medicare Other

## 2018-11-20 ENCOUNTER — Encounter (HOSPITAL_COMMUNITY): Payer: Medicare Other | Admitting: Internal Medicine

## 2018-11-26 ENCOUNTER — Other Ambulatory Visit (HOSPITAL_COMMUNITY): Payer: Self-pay | Admitting: Internal Medicine

## 2018-12-26 IMAGING — CT CT CHEST HIGH RESOLUTION W/O CM
2 of 6 series · 15 of 36 positions shown, 18 images · non-contrast
Comparison: 05/31/2012 chest CT angiogram.

CLINICAL DATA: Abnormal pulmonary function tests.

EXAM:
CT CHEST WITHOUT CONTRAST
TECHNIQUE: Multidetector CT imaging of the chest was performed following the
standard protocol without intravenous contrast. High resolution
imaging of the lungs, as well as inspiratory and expiratory imaging,
was performed.

[Series 4: chest w/o 5mm st · axial · non-contrast · 0.82mm/px · z∈[+1032,+1338]mm · 12 of 173 slices shown, 15 images]
[im 10/173  mediastinal]
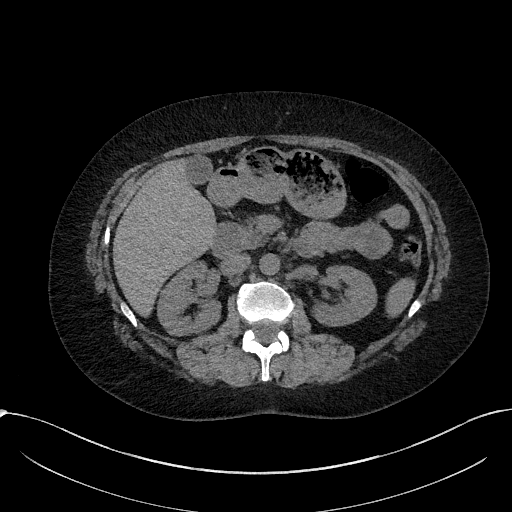
[im 10/173  lung]
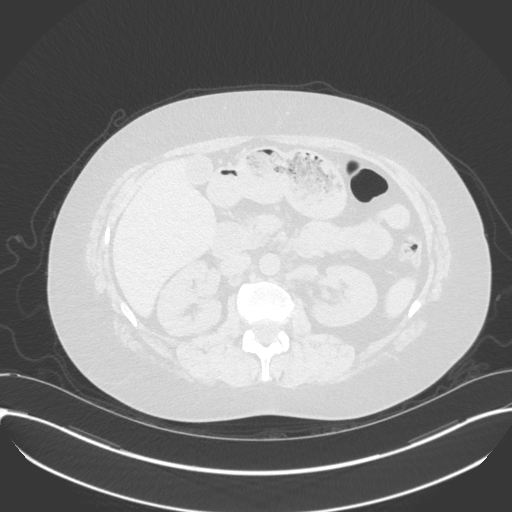
[im 29/173  lung]
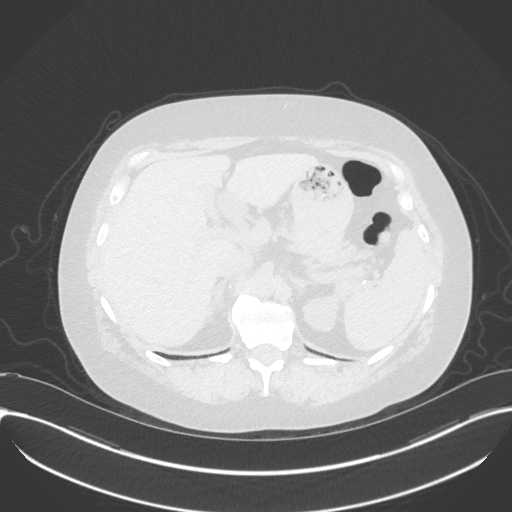
[im 39/173  lung]
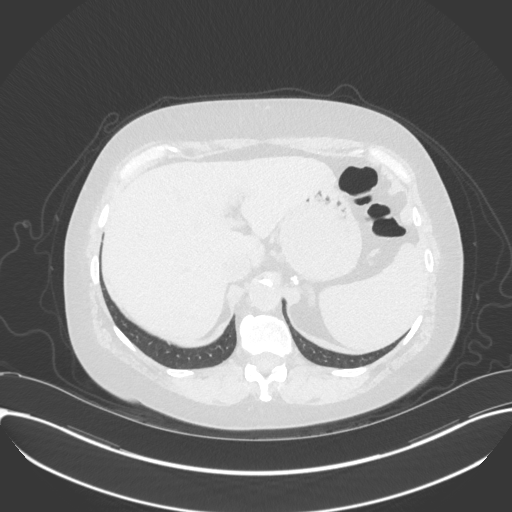
[im 48/173  lung]
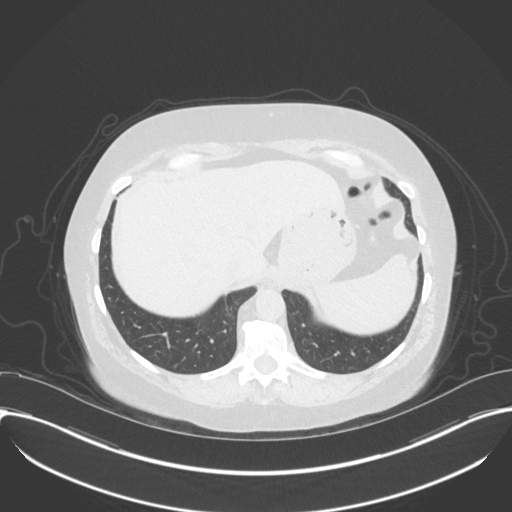
[im 67/173  mediastinal]
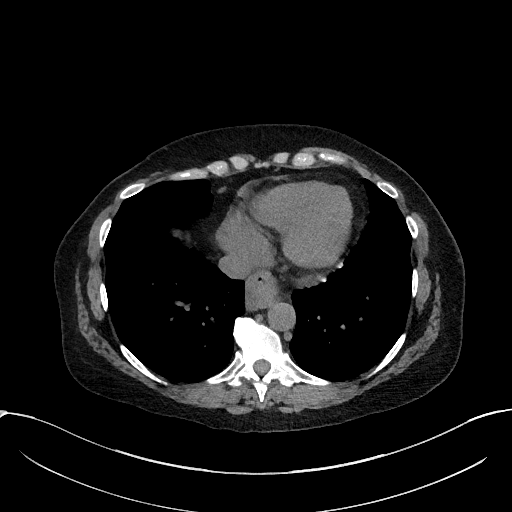
[im 67/173  lung]
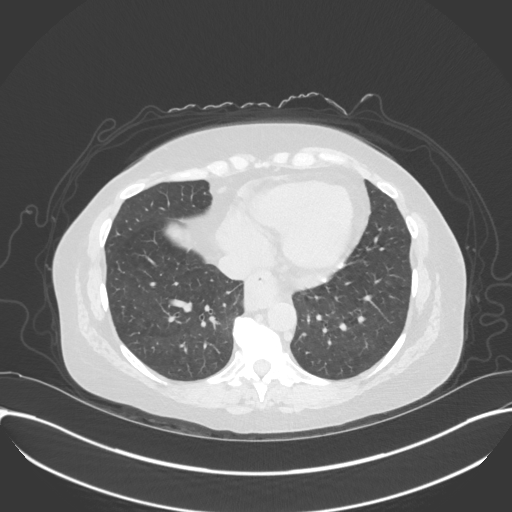
[im 77/173  lung]
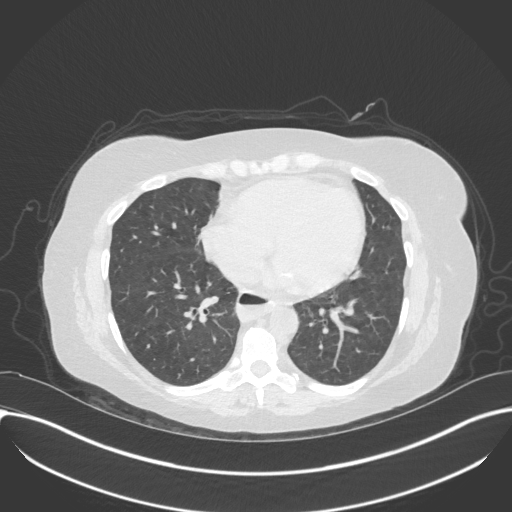
[im 96/173  lung]
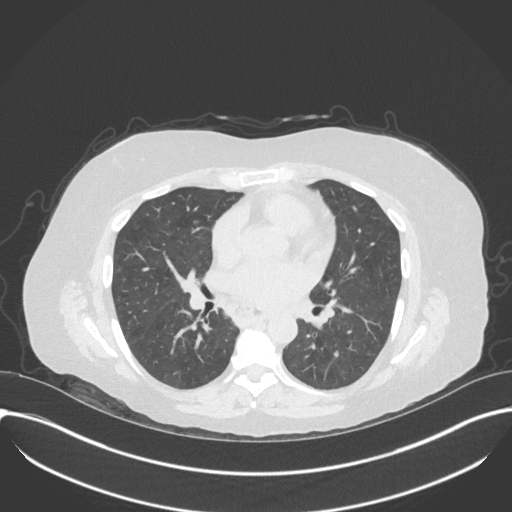
[im 106/173  lung]
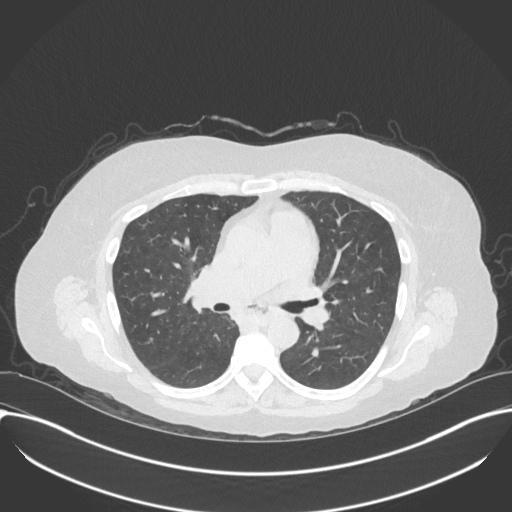
[im 125/173  mediastinal]
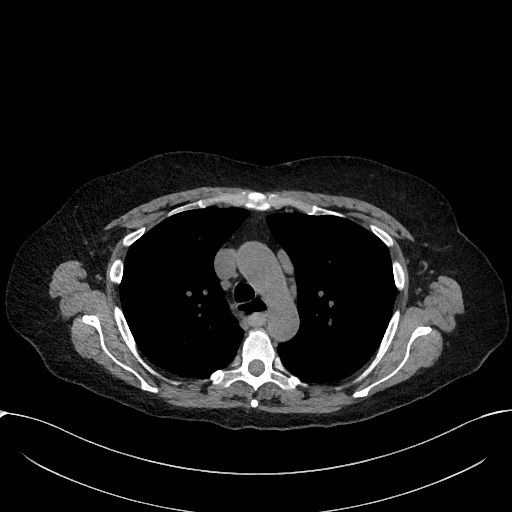
[im 125/173  lung]
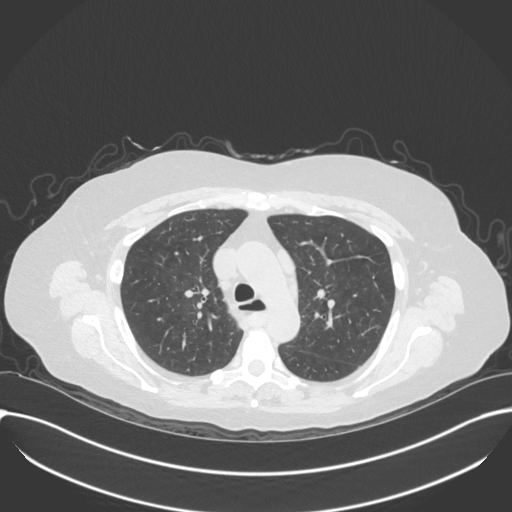
[im 134/173  lung]
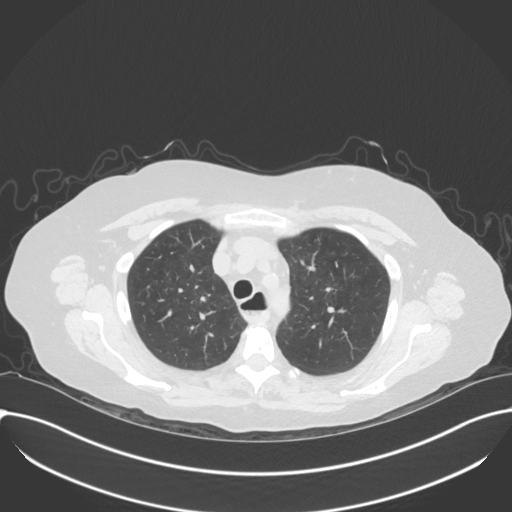
[im 144/173  lung]
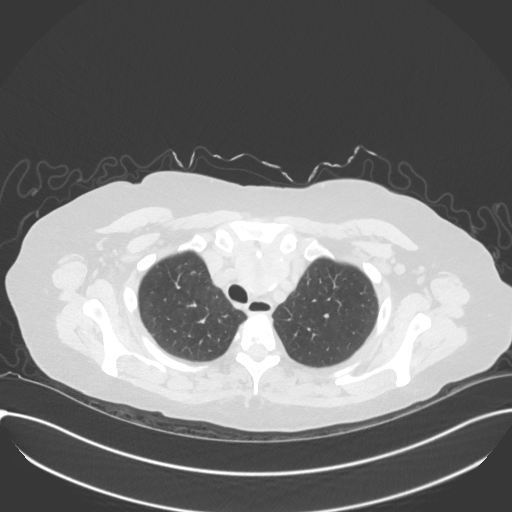
[im 163/173  lung]
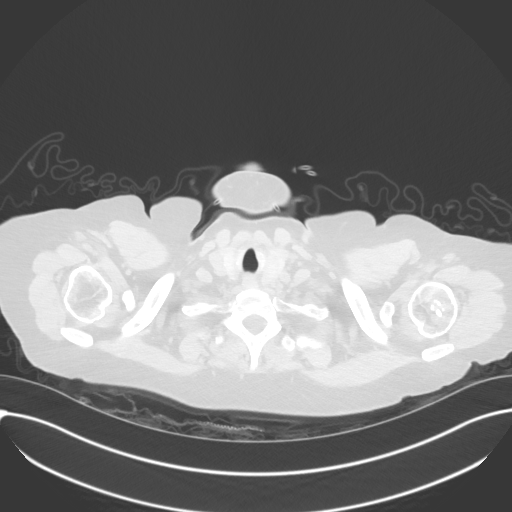

[Series 8: chest w/o 3mm st cor · coronal · non-contrast · 0.65mm/px · 3 of 90 slices shown]
[im 18/90  lung]
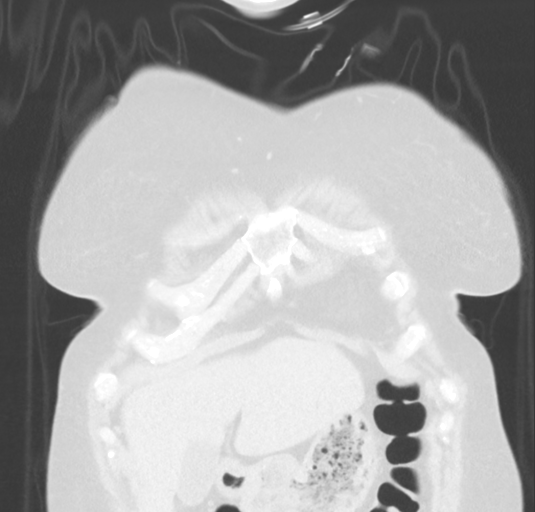
[im 36/90  lung]
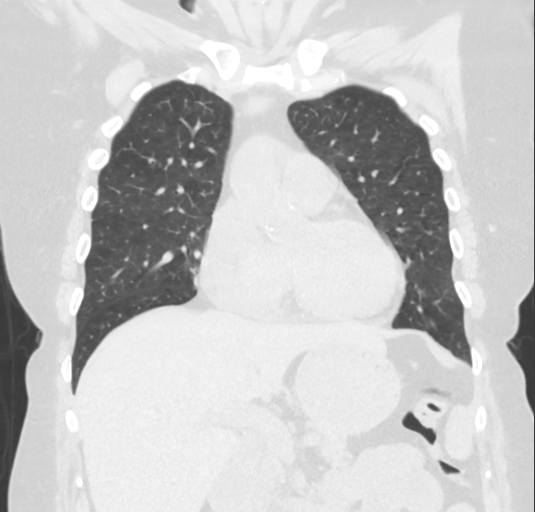
[im 54/90  lung]
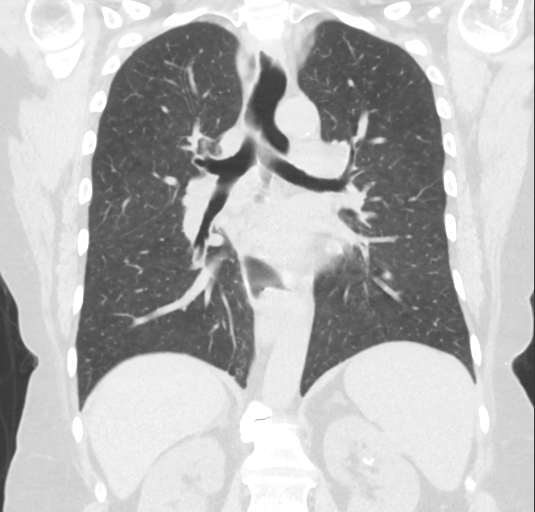

[15 of 36 positions shown; findings below may reference images not displayed]

FINDINGS: Cardiovascular: Normal heart size. Stable trace pericardial
effusion/thickening. Atherosclerotic nonaneurysmal thoracic aorta.
Dilated main pulmonary artery (3.6 cm diameter), stable.

Mediastinum/Nodes: Partially peripherally calcified heterogeneously
hypodense 4.7 cm left thyroid lobe nodule, increased from 4.2 cm on
05/31/2012. Mildly patulous fluid-filled thoracic esophagus without
appreciable esophageal wall thickening. No pathologically enlarged
axillary, mediastinal or gross hilar lymph nodes, noting limited
sensitivity for the detection of hilar adenopathy on this
noncontrast study.

Lungs/Pleura: No pneumothorax. No pleural effusion. No acute
consolidative airspace disease or lung masses. There are at least 5
scattered tiny pulmonary nodules throughout both lungs measuring up
to 4 mm in the right upper lobe (series 5/ image 67), all stable
since 05/31/2012 chest CT and considered benign. No new significant
pulmonary nodules. There is a patchy mosaic attenuation throughout
both lungs, favored to represent mosaic perfusion due to pulmonary
vascular disease given the absence of significant air trapping on
the expiration sequence. Stable mild osteophyte related fibrosis in
the medial right lower lobe. Otherwise no significant regions of
subpleural reticulation, ground-glass attenuation, parenchymal
banding, architectural distortion, traction bronchiectasis or frank
honeycombing.

Upper abdomen: Small to moderate hiatal hernia. Nonobstructing 8 mm
upper left renal stone. Partially visualized renal cyst in the
lateral interpolar right kidney measuring at least 1.1 cm, the
visualized portions of which appear simple.

Musculoskeletal: No aggressive appearing focal osseous lesions.
Moderate thoracic spondylosis.
IMPRESSION: 1. Stable dilated main pulmonary artery (3.6 cm diameter),
suggesting chronic pulmonary arterial hypertension.
2. Patchy mosaic attenuation throughout both lungs, favored to
represent mosaic perfusion due to pulmonary vascular disease, see
comments.
3. No evidence of interstitial lung disease.
4. Small to moderate hiatal hernia. Mildly patulous fluid-filled
thoracic esophagus, suggesting esophageal dysmotility and/or
gastroesophageal reflux.
5. Interval growth of peripherally calcified 4.7 cm left thyroid
lobe nodule. Consider correlation with repeat thyroid ultrasound.
6. Aortic atherosclerosis.
7. Nonobstructing upper left renal stone.

## 2019-02-12 ENCOUNTER — Other Ambulatory Visit (HOSPITAL_COMMUNITY): Payer: Self-pay | Admitting: Respiratory Therapy

## 2019-02-17 ENCOUNTER — Encounter (HOSPITAL_COMMUNITY): Payer: Medicare Other

## 2019-02-17 ENCOUNTER — Encounter (HOSPITAL_COMMUNITY): Payer: Medicare Other | Admitting: Internal Medicine

## 2019-02-19 ENCOUNTER — Other Ambulatory Visit (HOSPITAL_COMMUNITY): Payer: Self-pay

## 2019-02-19 MED ORDER — POTASSIUM CHLORIDE CRYS ER 10 MEQ PO TBCR: 10.0000 meq | EXTENDED_RELEASE_TABLET | Freq: Every day | ORAL | 3 refills | Status: DC

## 2019-02-25 ENCOUNTER — Other Ambulatory Visit (HOSPITAL_COMMUNITY): Payer: Self-pay

## 2019-02-25 MED ORDER — DILTIAZEM HCL ER COATED BEADS 240 MG PO CP24: ORAL_CAPSULE | ORAL | 1 refills | Status: DC

## 2019-02-26 ENCOUNTER — Other Ambulatory Visit (HOSPITAL_COMMUNITY): Payer: Self-pay | Admitting: Internal Medicine

## 2019-03-10 ENCOUNTER — Other Ambulatory Visit (HOSPITAL_COMMUNITY): Payer: Self-pay | Admitting: Internal Medicine

## 2019-04-28 ENCOUNTER — Other Ambulatory Visit (HOSPITAL_COMMUNITY): Payer: Self-pay | Admitting: *Deleted

## 2019-04-28 MED ORDER — TADALAFIL (PAH) 20 MG PO TABS: 40.0000 mg | ORAL_TABLET | Freq: Every day | ORAL | 3 refills | Status: DC

## 2019-05-07 ENCOUNTER — Encounter (HOSPITAL_COMMUNITY): Payer: Self-pay

## 2019-05-12 ENCOUNTER — Encounter (HOSPITAL_COMMUNITY): Payer: Medicare Other | Admitting: Internal Medicine

## 2019-05-12 ENCOUNTER — Encounter (HOSPITAL_COMMUNITY): Payer: Medicare Other

## 2019-05-13 ENCOUNTER — Telehealth (HOSPITAL_COMMUNITY): Payer: Self-pay | Admitting: Pharmacy Technician

## 2019-05-13 NOTE — Telephone Encounter (Signed)
Patient Advocate Encounter   Received notification from [facility] Medicare Part D that prior authorization for Opsumit is required.   PA submitted on CoverMyMeds Key [id number] Status is pending   Will continue to follow.  Charlann Boxer, CPhT

## 2019-05-14 NOTE — Telephone Encounter (Signed)
Advanced Heart Failure Patient Advocate Encounter  Prior Authorization for Opsumit has been approved.    PA#  [id number] Effective dates: 05/13/2019 through 06/04/2020  Charlann Boxer, CPhT

## 2019-06-02 ENCOUNTER — Telehealth (HOSPITAL_COMMUNITY): Payer: Self-pay | Admitting: Pharmacy Technician

## 2019-06-02 NOTE — Telephone Encounter (Signed)
Advanced Heart Failure Patient Advocate Encounter  Prior Authorization for Tadalafil has been approved.    PA# [id number] Effective dates: 06/02/2019 through 06/04/2020  Charlann Boxer, CPhT

## 2019-06-02 NOTE — Telephone Encounter (Signed)
Patient Advocate Encounter   Received notification from [facility] Part D that prior authorization for Tadalafil is required.   PA submitted on CoverMyMeds Key [id number] Status is pending   Will continue to follow.  Charlann Boxer, CPhT

## 2019-06-13 ENCOUNTER — Telehealth (HOSPITAL_COMMUNITY): Payer: Self-pay | Admitting: Pharmacist

## 2019-06-13 NOTE — Telephone Encounter (Signed)
Patient's insurance changed for 2021 to [facility]. Faxed in PA's for tadalafil and Opsumit.  Fax: [phone]  Karle Plumber, PharmD, BCPS, BCCP, CPP Heart Failure Clinic Pharmacist [phone]

## 2019-06-16 NOTE — Telephone Encounter (Signed)
Advanced Heart Failure Patient Advocate Encounter  Prior Authorization for Opsumit has been approved.    Effective dates: 06/16/19 through 06/04/20  Karle Plumber, PharmD, BCPS, BCCP, CPP Heart Failure Clinic Pharmacist [phone]

## 2019-06-16 NOTE — Telephone Encounter (Signed)
Advanced Heart Failure Patient Advocate Encounter  Prior Authorization for ADCIRCA 20 mg has been approved.    Effective dates: 06/16/19 through 06/04/20  Karle Plumber, PharmD, BCPS, BCCP, CPP Heart Failure Clinic Pharmacist [phone]

## 2019-06-20 ENCOUNTER — Other Ambulatory Visit (HOSPITAL_COMMUNITY): Payer: Self-pay | Admitting: *Deleted

## 2019-06-20 ENCOUNTER — Other Ambulatory Visit (HOSPITAL_COMMUNITY): Payer: Self-pay

## 2019-06-20 MED ORDER — TADALAFIL (PAH) 20 MG PO TABS: 40.0000 mg | ORAL_TABLET | Freq: Every day | ORAL | 3 refills | Status: DC

## 2019-06-20 MED ORDER — OPSUMIT 10 MG PO TABS: ORAL_TABLET | ORAL | 11 refills | Status: DC

## 2019-06-20 MED ORDER — TADALAFIL (PAH) 20 MG PO TABS: 40.0000 mg | ORAL_TABLET | Freq: Every day | ORAL | 11 refills | Status: DC

## 2019-06-20 MED ORDER — OPSUMIT 10 MG PO TABS: ORAL_TABLET | ORAL | 3 refills | Status: DC

## 2019-06-20 NOTE — Addendum Note (Signed)
Addended by: Noralee Space on: 06/20/2019 01:49 PM   Modules accepted: Orders

## 2019-06-25 ENCOUNTER — Other Ambulatory Visit (HOSPITAL_COMMUNITY): Payer: Self-pay

## 2019-06-25 MED ORDER — TADALAFIL (PAH) 20 MG PO TABS: 40.0000 mg | ORAL_TABLET | Freq: Every day | ORAL | 11 refills | Status: DC

## 2019-08-15 ENCOUNTER — Other Ambulatory Visit (HOSPITAL_COMMUNITY): Payer: Self-pay | Admitting: Internal Medicine

## 2019-08-24 ENCOUNTER — Other Ambulatory Visit (HOSPITAL_COMMUNITY): Payer: Self-pay | Admitting: Internal Medicine

## 2019-09-02 ENCOUNTER — Other Ambulatory Visit (HOSPITAL_COMMUNITY): Payer: Self-pay | Admitting: Internal Medicine

## 2019-09-10 ENCOUNTER — Telehealth (HOSPITAL_COMMUNITY): Payer: Self-pay | Admitting: *Deleted

## 2019-09-10 DIAGNOSIS — I272 Pulmonary hypertension, unspecified: Secondary | ICD-10-CM

## 2019-09-10 NOTE — Telephone Encounter (Signed)
Pt is past due for her echo, pft's and f/u w/Dr Bensimhon. She states she has now been vaccinated and would like to schedule appts. Orders placed, message to schedulers to arrange

## 2019-09-17 ENCOUNTER — Other Ambulatory Visit (HOSPITAL_COMMUNITY): Payer: Self-pay | Admitting: Internal Medicine

## 2019-10-03 ENCOUNTER — Other Ambulatory Visit (HOSPITAL_COMMUNITY): Payer: Self-pay

## 2019-10-03 MED ORDER — SPIRONOLACTONE 25 MG PO TABS: 12.5000 mg | ORAL_TABLET | Freq: Every day | ORAL | 0 refills | Status: DC

## 2019-11-13 ENCOUNTER — Other Ambulatory Visit (HOSPITAL_COMMUNITY)
Admission: RE | Admit: 2019-11-13 | Discharge: 2019-11-13 | Disposition: A | Discharge status: Home / Self Care | Payer: Medicare PPO | Source: Ambulatory Visit | Attending: Internal Medicine | Admitting: Internal Medicine

## 2019-11-13 DIAGNOSIS — Z01812 Encounter for preprocedural laboratory examination: Secondary | ICD-10-CM | POA: Diagnosis present

## 2019-11-13 DIAGNOSIS — Z20822 Contact with and (suspected) exposure to covid-19: Secondary | ICD-10-CM | POA: Diagnosis not present

## 2019-11-13 LAB — SARS CORONAVIRUS 2 (TAT 6-24 HRS): SARS Coronavirus 2: NEGATIVE

## 2019-11-17 ENCOUNTER — Ambulatory Visit (HOSPITAL_BASED_OUTPATIENT_CLINIC_OR_DEPARTMENT_OTHER)
Admission: RE | Admit: 2019-11-17 | Discharge: 2019-11-17 | Disposition: A | Discharge status: Home / Self Care | Payer: Medicare PPO | Source: Ambulatory Visit | Attending: Internal Medicine | Admitting: Internal Medicine

## 2019-11-17 ENCOUNTER — Other Ambulatory Visit: Payer: Self-pay

## 2019-11-17 ENCOUNTER — Encounter (HOSPITAL_COMMUNITY): Payer: Self-pay | Admitting: Internal Medicine

## 2019-11-17 ENCOUNTER — Ambulatory Visit (HOSPITAL_COMMUNITY)
Admission: RE | Admit: 2019-11-17 | Discharge: 2019-11-17 | Disposition: A | Discharge status: Home / Self Care | Payer: Medicare PPO | Source: Ambulatory Visit | Attending: Internal Medicine | Admitting: Internal Medicine

## 2019-11-17 VITALS — BP 130/66 | HR 91 | Wt 165.0 lb

## 2019-11-17 DIAGNOSIS — I272 Pulmonary hypertension, unspecified: Secondary | ICD-10-CM

## 2019-11-17 DIAGNOSIS — J849 Interstitial pulmonary disease, unspecified: Secondary | ICD-10-CM

## 2019-11-17 LAB — BASIC METABOLIC PANEL
Anion gap: 14 (ref 5–15)
BUN: 10 mg/dL (ref 8–23)
CO2: 20 mmol/L — ABNORMAL LOW (ref 22–32)
Calcium: 9.8 mg/dL (ref 8.9–10.3)
Chloride: 104 mmol/L (ref 98–111)
Creatinine, Ser: 0.83 mg/dL (ref 0.44–1.00)
GFR calc Af Amer: >60 mL/min (ref >60)
GFR calc non Af Amer: >60 mL/min (ref >60)
Glucose, Bld: 102 mg/dL — ABNORMAL HIGH (ref 70–99)
Potassium: 4.2 mmol/L (ref 3.5–5.1)
Sodium: 138 mmol/L (ref 135–145)

## 2019-11-17 LAB — PULMONARY FUNCTION TEST
DL/VA % pred: 53 %
DL/VA: 2.21 ml/min/mmHg/L
DLCO unc % pred: 48 %
DLCO unc: 9.79 ml/min/mmHg
FEF 25-75 Post: 1.32 L/sec
FEF 25-75 Pre: 1.03 L/sec
FEF2575-%Change-Post: 28 %
FEF2575-%Pred-Post: 69 %
FEF2575-%Pred-Pre: 53 %
FEV1-%Change-Post: 7 %
FEV1-%Pred-Post: 95 %
FEV1-%Pred-Pre: 89 %
FEV1-Post: 2.24 L
FEV1-Pre: 2.08 L
FEV1FVC-%Change-Post: 10 %
FEV1FVC-%Pred-Pre: 87 %
FEV6-%Change-Post: 0 %
FEV6-%Pred-Post: 101 %
FEV6-%Pred-Pre: 100 %
FEV6-Post: 2.99 L
FEV6-Pre: 2.97 L
FEV6FVC-%Change-Post: 3 %
FEV6FVC-%Pred-Post: 102 %
FEV6FVC-%Pred-Pre: 98 %
FVC-%Change-Post: -2 %
FVC-%Pred-Post: 99 %
FVC-%Pred-Pre: 101 %
FVC-Post: 3.07 L
FVC-Pre: 3.14 L
Post FEV1/FVC ratio: 73 %
Post FEV6/FVC ratio: 98 %
Pre FEV1/FVC ratio: 66 %
Pre FEV6/FVC Ratio: 95 %
RV % pred: 83 %
RV: 1.88 L
TLC % pred: 96 %
TLC: 5 L

## 2019-11-17 MED ORDER — ROSUVASTATIN CALCIUM 10 MG PO TABS: 10.0000 mg | ORAL_TABLET | Freq: Every day | ORAL | 3 refills | Status: DC

## 2019-11-17 MED ORDER — ALBUTEROL SULFATE (2.5 MG/3ML) 0.083% IN NEBU
2.5000 mg | INHALATION_SOLUTION | Freq: Once | RESPIRATORY_TRACT | Status: AC
  Administered 2019-11-17: 2.5 mg via RESPIRATORY_TRACT

## 2019-11-17 NOTE — Progress Notes (Addendum)
Patient ID: Olivia Washington, female   DOB: [birth year], 74 y.o.   MRN: [id number]  Advanced Heart Failure Clinic Note  Primary Care: Pending Rheumatologist: Dr. Dierdre Forth ENT: Dr. Annalee Genta   HPI:  Olivia Washington is a 74  y.o. 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 Non-smoker.  In 1/18 DLCO was down so we did hi-res CT. No ILD. Mosaic pattern c/w pHTN  Today she returns for [facility] follow up. Says she feels great. Active around the house but not exercising any more. Wearing 2LO2. No SOB. Sats 95% or better. No edema. No syncope or presyncope. No prob with medicines. + hacking cough  Echo 6/21 EF 65% RV normal. No evidence PAH. IVC small Personally reviewed  Echo 9/19 EF 60% RV normal Trivial TR RVSP ~29mmHG. IVC small. Personally reviewed Echo 4/18 EF 60% RV normal IVC normal RVSP Echo 12/29/15 EF 55-60% RV normal. Trivial TR RVSP 35-40 IVC normal     PFTs 11/17/19 FVC 3.14 (101%) FEV 2.08 (89%) FEV1/FVC 66% DLCO 48%   PFTs 05/25/16  FVC 3.17 (99%) FEV 2.30 (95%) FEV1/FVC 72 TLCO 4.01 (77%) DLCO 9.65 (37%)  PH Work up:  TTE 05/13/12: LVEF 55-60%. Grade 1 diastolic dysfunction. Ventricular septum with diastolic flattening. LA mildly dilated. RV mildly dilated with systolic function mod reduced. RA mildly dilated. Atrial septum bowed right to left. PAPP 54 mmHg.  CT scan chest 12/3/130 showed no ILD, no acute or chronic PE but some dilation of the PA's. Left thyroid enlargement with rightward tracheal deviation (ENT eval pending)  VQ scan 05/27/12: normal, no PE  RHC 05/24/12  RA: 11/8/6  RV: 86/7  PA: 78/27 (47) PCWP: 8/8/5   Cardiac Output  Thermodilution: 3.6 with index of 1.9  Fick : 5.9 with index of 3.1  11.6 woods units  At final adenosine dose  76/28 (46)  CO (thermo) = 4.3 with index of 2.3  9.5 Woods units  LHC: normal cors  PFTs and sleep study  completed  07/03/12 Sleep study 1. Small numbers of obstructive events which do not meet the AHI  criteria for the obstructive sleep apnea syndrome.  2. Transient REM associated desaturation as low as 82% during the  night. However, the patient only spent 23 minutes the entire night  less than 88%  06/22/12 1150 ft, O2 sat did drop to 82% on RA and pt was placed on 2L O2 via [place] and O2 sat improved to 92%.  11/14: 6 min walk test completed, pt ambulated 1110 ft (338 m), O2 sat ranged from 89-99 % on 2L, HR ranged from 84-113 1160 ft on O2 6L 09/23/13 1000 ft sats 88% 2L. (different person walking with her)  7/15  1350 feet 87-100%  4/16 1200 feet, 366 m 8/16 1340 feet, 408 m  7/17  1480 feet 455m on 2L O2 sat nadir at 88%  09/27/16 1240 ft (378 m) on 2L O2 sats ranged 85-96%, HR ranged 98-123. 6 MW 11/22/16 1220 ft (372m) 6 MW 10/08/17 1290 ft (358m)  ROS: All systems negative except as listed in HPI, PMH and Problem List.  Past Medical History:  Diagnosis Date  . Allergy history unknown   . CREST (calcinosis, Raynaud's phenomenon, esophageal dysfunction, sclerodactyly, telangiectasia) (HCC)   . CREST syndrome (HCC)   . GERD (gastroesophageal reflux disease)   . Heart palpitations   .  Hyperlipidemia   . Hypertension   . Hypertension   . Migraine   . Osteoarthrosis    unspecified whether generalized or localized  . Pulmonary hypertension ([place])   . SOB (shortness of breath)   . Thyroid nodule     Current Outpatient Medications  Medication Sig Dispense Refill  . acetaminophen (TYLENOL) 650 MG CR tablet Take 1,300 mg by mouth every 8 (eight) hours as needed for pain.    Marland Kitchen aspirin 81 MG tablet Take 81 mg by mouth daily.      . Calcium-Vitamin D-Vitamin K 144-315-40 MG-UNT-MCG CHEW Chew by mouth 2 (two) times daily.    . Cranberry 1000 MG CAPS Take 1,000 mg by mouth daily.    Marland Kitchen diltiazem (CARDIZEM CD) 240 MG 24 hr capsule TAKE 1 CAPSULE BY MOUTH EVERY  DAY 90 capsule 1  . diphenhydrAMINE-Phenylephrine 6.25-2.5 MG/5ML LIQD Take by mouth as needed.     . furosemide (LASIX) 20 MG tablet TAKE 1 TABLET (20 MG TOTAL) BY MOUTH DAILY. NEEDS APPT FOR FURTHER REFILLS 30 tablet 3  . lansoprazole (PREVACID) 30 MG capsule Take 30 mg by mouth 2 (two) times daily.      . macitentan (OPSUMIT) 10 MG tablet TAKE 1 TABLET BY MOUTH DAILY. DO NOT HANDLE IF PREGNANT. DO NOT SPLIT,CRUSH, OR CHEW. REVIEW MEDICATION GUIDE. 30 tablet 11  . potassium chloride (KLOR-CON M10) 10 MEQ tablet Take 1 tablet (10 mEq total) by mouth daily. 90 tablet 3  . rosuvastatin (CRESTOR) 10 MG tablet Take 10 mg by mouth daily.     Marland Kitchen spironolactone (ALDACTONE) 25 MG tablet Take 0.5 tablets (12.5 mg total) by mouth daily. Pt need to keep next scheduled appointment. 45 tablet 0  . tadalafil, PAH, (ALYQ) 20 MG tablet Take 2 tablets (40 mg total) by mouth daily. 60 tablet 11  . vitamin E (VITAMIN E) 400 UNIT capsule Take 400 Units by mouth daily.       No current facility-administered medications for this encounter.     PHYSICAL EXAM: Vitals:   11/17/19 1101  BP: 130/66  Pulse: 91  SpO2: 99%  Weight: 74.8 kg (165 lb)   Filed Weights   11/17/19 1101  Weight: 74.8 kg (165 lb)   General:  Well appearing. No resp difficulty On O2 HEENT: normal Neck: supple. no JVD 6-7. Carotids 2+ bilat; no bruits. No lymphadenopathy or thryomegaly appreciated. Cor: PMI nondisplaced. Regular rate & rhythm. No rubs, gallops or murmurs. Lungs: clear with decreased BS throughout Abdomen: soft, nontender, nondistended. No hepatosplenomegaly. No bruits or masses. Good bowel sounds. Extremities: no cyanosis, clubbing, rash, tr edema  + arthritic changes and telengectasias  Neuro: alert & orientedx3, cranial nerves grossly intact. moves all 4 extremities w/o difficulty. 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 CREST syndrome  3. HTN 4. Diastolic HF 5. Chronic hypoxic respiratory failure 6. HL   Overall doing quite well. Stable NYHA II. Echo today. No evidence of residual PAH on combination therapy. Will continue. No indication for selexipeg at this time Check 6MW today. Will repeat hi-RES CT and have her f/u with Dr. Lamonte Sakai regarding cough and ILD.    Glori Bickers, MD  12:15 PM

## 2019-11-17 NOTE — Patient Instructions (Addendum)
Labs done today, your results will be available in MyChart, we will contact you for abnormal readings.  6 min walk test was done today.  A refill of Crestor 10mg  was electronically sent into the pharmacy for you.  Dr. request that you have a High Resolution chest CT done. This has to be approved by your insurance company prior to scheduling. Our office will contact you at a later date to schedule this appointment.  You have been referred to Dr Gala Romney at [facility]  Please call our office in November to schedule your follow up appointment.   If you have any questions or concerns before your next appointment please send [place] a message through [place] or call our office at [phone].    TO LEAVE A MESSAGE FOR THE NURSE SELECT OPTION 2, PLEASE LEAVE A MESSAGE INCLUDING: . YOUR NAME . DATE OF BIRTH . CALL BACK NUMBER . REASON FOR CALL**this is important as we prioritize the call backs  YOU WILL RECEIVE A CALL BACK THE SAME DAY AS LONG AS YOU CALL BEFORE 4:00 PM   At the [facility], you and your health needs are our priority. As part of our continuing mission to provide you with exceptional heart care, we have created designated Provider Care Teams. These Care Teams include your primary Cardiologist (physician) and Advanced Practice Providers (APPs- Physician Assistants and Nurse Practitioners) who all work together to provide you with the care you need, when you need it.   You may see any of the following providers on your designated Care Team at your next follow up: [phone] Dr Marland Kitchen . Dr Arvilla Meres . Marca Ancona, NP . Tonye Becket, PA . Robbie Lis, PharmD   Please be sure to bring in all your medications bottles to every appointment.

## 2019-11-17 NOTE — Progress Notes (Signed)
Echocardiogram 2D Echocardiogram has been performed.  Warren Lacy Dyshaun Bonzo 11/17/2019, 10:36 AM

## 2019-11-19 NOTE — Addendum Note (Signed)
Encounter addended by: Chinita Pester, CMA on: 11/19/2019 9:20 AM  Actions taken: Flowsheet accepted

## 2019-12-10 ENCOUNTER — Other Ambulatory Visit (HOSPITAL_COMMUNITY): Payer: Medicare PPO

## 2019-12-15 ENCOUNTER — Ambulatory Visit (HOSPITAL_COMMUNITY)
Admission: RE | Admit: 2019-12-15 | Discharge: 2019-12-15 | Disposition: A | Discharge status: Home / Self Care | Payer: Medicare PPO | Source: Ambulatory Visit | Attending: Internal Medicine | Admitting: Internal Medicine

## 2019-12-15 ENCOUNTER — Other Ambulatory Visit: Payer: Self-pay

## 2019-12-15 DIAGNOSIS — J849 Interstitial pulmonary disease, unspecified: Secondary | ICD-10-CM | POA: Insufficient documentation

## 2019-12-22 ENCOUNTER — Other Ambulatory Visit (HOSPITAL_COMMUNITY): Payer: Self-pay | Admitting: Internal Medicine

## 2020-02-28 ENCOUNTER — Other Ambulatory Visit (HOSPITAL_COMMUNITY): Payer: Self-pay | Admitting: Internal Medicine

## 2020-03-16 ENCOUNTER — Other Ambulatory Visit (HOSPITAL_COMMUNITY): Payer: Self-pay | Admitting: Internal Medicine

## 2020-05-14 ENCOUNTER — Ambulatory Visit (HOSPITAL_COMMUNITY)
Admission: RE | Admit: 2020-05-14 | Discharge: 2020-05-14 | Disposition: A | Discharge status: Home / Self Care | Payer: Medicare PPO | Source: Ambulatory Visit | Attending: Internal Medicine | Admitting: Internal Medicine

## 2020-05-14 ENCOUNTER — Other Ambulatory Visit: Payer: Self-pay

## 2020-05-14 ENCOUNTER — Encounter (HOSPITAL_COMMUNITY): Payer: Self-pay | Admitting: Internal Medicine

## 2020-05-14 VITALS — BP 160/70 | HR 96 | Wt 162.0 lb

## 2020-05-14 DIAGNOSIS — M341 CR(E)ST syndrome: Secondary | ICD-10-CM | POA: Diagnosis not present

## 2020-05-14 DIAGNOSIS — I5030 Unspecified diastolic (congestive) heart failure: Secondary | ICD-10-CM | POA: Diagnosis not present

## 2020-05-14 DIAGNOSIS — I5032 Chronic diastolic (congestive) heart failure: Secondary | ICD-10-CM | POA: Diagnosis not present

## 2020-05-14 DIAGNOSIS — I2721 Secondary pulmonary arterial hypertension: Secondary | ICD-10-CM | POA: Insufficient documentation

## 2020-05-14 DIAGNOSIS — Z79899 Other long term (current) drug therapy: Secondary | ICD-10-CM | POA: Diagnosis not present

## 2020-05-14 DIAGNOSIS — I10 Essential (primary) hypertension: Secondary | ICD-10-CM

## 2020-05-14 DIAGNOSIS — E785 Hyperlipidemia, unspecified: Secondary | ICD-10-CM | POA: Diagnosis not present

## 2020-05-14 DIAGNOSIS — I272 Pulmonary hypertension, unspecified: Secondary | ICD-10-CM | POA: Diagnosis not present

## 2020-05-14 DIAGNOSIS — I110 Hypertensive heart disease with heart failure: Secondary | ICD-10-CM | POA: Insufficient documentation

## 2020-05-14 DIAGNOSIS — J9611 Chronic respiratory failure with hypoxia: Secondary | ICD-10-CM | POA: Diagnosis not present

## 2020-05-14 DIAGNOSIS — K219 Gastro-esophageal reflux disease without esophagitis: Secondary | ICD-10-CM | POA: Diagnosis not present

## 2020-05-14 DIAGNOSIS — Z7982 Long term (current) use of aspirin: Secondary | ICD-10-CM | POA: Insufficient documentation

## 2020-05-14 HISTORY — DX: Heart failure, unspecified: I50.9

## 2020-05-14 LAB — LIPID PANEL
Cholesterol: 169 mg/dL (ref 0–200)
HDL: 56 mg/dL (ref >40)
LDL Cholesterol: 91 mg/dL (ref 0–99)
Total CHOL/HDL Ratio: 3 RATIO
Triglycerides: 110 mg/dL (ref <150)
VLDL: 22 mg/dL (ref 0–40)

## 2020-05-14 LAB — BASIC METABOLIC PANEL
Anion gap: 10 (ref 5–15)
BUN: 9 mg/dL (ref 8–23)
CO2: 24 mmol/L (ref 22–32)
Calcium: 10.4 mg/dL — ABNORMAL HIGH (ref 8.9–10.3)
Chloride: 104 mmol/L (ref 98–111)
Creatinine, Ser: 0.66 mg/dL (ref 0.44–1.00)
GFR, Estimated: >60 mL/min (ref >60)
Glucose, Bld: 109 mg/dL — ABNORMAL HIGH (ref 70–99)
Potassium: 3.9 mmol/L (ref 3.5–5.1)
Sodium: 138 mmol/L (ref 135–145)

## 2020-05-14 MED ORDER — SPIRONOLACTONE 25 MG PO TABS: 25.0000 mg | ORAL_TABLET | Freq: Every day | ORAL | 3 refills | Status: DC

## 2020-05-14 NOTE — Patient Instructions (Signed)
INCREASE Spironolactone to 25 mg, one tab daily  Labs today We will only contact you if something comes back abnormal or we need to make some changes. Otherwise no news is good news!  Labs needed in 7-10 days  Your physician recommends that you schedule a follow-up appointment in: 6 months with Dr Gala Romney and echo  Your physician has requested that you have an echocardiogram. Echocardiography is a painless test that uses sound waves to create images of your heart. It provides your doctor with information about the size and shape of your heart and how well your heart's chambers and valves are working. This procedure takes approximately one hour. There are no restrictions for this procedure.  If you have any questions or concerns before your next appointment please send [place] a message through [place] or call our office at [phone].    TO LEAVE A MESSAGE FOR THE NURSE SELECT OPTION 2, PLEASE LEAVE A MESSAGE INCLUDING: . YOUR NAME . DATE OF BIRTH . CALL BACK NUMBER . REASON FOR CALL**this is important as we prioritize the call backs  YOU WILL RECEIVE A CALL BACK THE SAME DAY AS LONG AS YOU CALL BEFORE 4:00 PM

## 2020-05-14 NOTE — Progress Notes (Signed)
6 Min Walk Test Completed  Pt ambulated 304.8 meters O2 Sat ranged 90-92% on 2L oxygen HR ranged 91-137

## 2020-05-14 NOTE — Progress Notes (Signed)
Patient ID: GLENDINE SWETZ, female   DOB: Dec 29, 1945, 74 y.o.   MRN: [id number]  Advanced Heart Failure Clinic Note  Primary Care: Dr. Anda Latina Rheumatologist: Dr. Dierdre Forth ENT: Dr. Annalee Genta  HF Cardiologist: Dr. Gala Romney  HPI:  Ms. Boden is a 74  y.o. 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 Non-smoker.  In 1/18 DLCO was down so we did hi-res CT. No ILD. Mosaic pattern c/w pHTN.  Today she returns for [facility] follow up. Says she feels great. Active around the house, but not exercising. Wearing 2L O2. No SOB. No edema. No syncope or presyncope. Weights stable. No prob with medicines. + hacking cough, better but still present. Clears thick mucous from throat every morning, coughs a lot during the day. No orthopnea/PND, CP, fever or chills.  Echo 6/21 EF 65% RV normal. No evidence PAH. IVC small Personally reviewed  Echo 9/19 EF 60% RV normal Trivial TR RVSP ~5mmHG. IVC small. Personally reviewed  Echo 4/18 EF 60% RV normal IVC normal RVSP  Echo 12/29/15 EF 55-60% RV normal. Trivial TR RVSP 35-40 IVC normal    PFTs 11/17/19 FVC 3.14 (101%) FEV 2.08 (89%) FEV1/FVC 66% DLCO 48%   PFTs 05/25/16  FVC 3.17 (99%) FEV 2.30 (95%) FEV1/FVC 72 TLCO 4.01 (77%) DLCO 9.65 (37%)  PH Work up:  TTE 05/13/12: LVEF 55-60%. Grade 1 diastolic dysfunction. Ventricular septum with diastolic flattening. LA mildly dilated. RV mildly dilated with systolic function mod reduced. RA mildly dilated. Atrial septum bowed right to left. PAPP 54 mmHg.  CT scan chest 12/3/130 showed no ILD, no acute or chronic PE but some dilation of the PA's. Left thyroid enlargement with rightward tracheal deviation (ENT eval pending)  VQ scan 05/27/12: normal, no PE   RHC 05/24/12  RA: 11/8/6  RV: 86/7  PA: 78/27 (47) PCWP: 8/8/5   Cardiac Output  Thermodilution: 3.6 with index of 1.9  Fick : 5.9  with index of 3.1  11.6 woods units  At final adenosine dose  76/28 (46)  CO (thermo) = 4.3 with index of 2.3  9.5 Woods units  LHC: normal cors   PFTs and sleep study completed  07/03/12 Sleep study 1. Small numbers of obstructive events which do not meet the AHI  criteria for the obstructive sleep apnea syndrome.  2. Transient REM associated desaturation as low as 82% during the  night. However, the patient only spent 23 minutes the entire night  less than 88%  06/22/12 1150 ft, O2 sat did drop to 82% on RA and pt was placed on 2L O2 via [place] and O2 sat improved to 92%.  11/14: 6 min walk test completed, pt ambulated 1110 ft (338 m), O2 sat ranged from 89-99 % on 2L, HR ranged from 84-113 1160 ft on O2 6L 09/23/13 1000 ft sats 88% 2L. (different person walking with her)  7/15  1350 feet 87-100%  4/16 1200 feet, 366 m 8/16 1340 feet, 408 m  7/17  1480 feet 463m on 2L O2 sat nadir at 88%  09/27/16 1240 ft (378 m) on 2L O2 sats ranged 85-96%, HR ranged 98-123. 11/22/16 1220 ft (347m) 10/08/17 1290 ft (362m) 05/14/20: on 2L O2 305 meters, O2 on 2L~90-92%, HR 91-137 bpm  ROS: All systems negative except as listed in HPI, PMH and Problem List.  Past Medical History:  Diagnosis Date  . Allergy history unknown   . CHF (congestive heart failure) (HCC)   . CREST (calcinosis, Raynaud's phenomenon, esophageal dysfunction, sclerodactyly, telangiectasia) (HCC)   . CREST syndrome (HCC)   . GERD (gastroesophageal reflux disease)   . Heart palpitations   . Hyperlipidemia   . Hypertension   . Hypertension   . Migraine   . Osteoarthrosis    unspecified whether generalized or localized  . Pulmonary hypertension (HCC)   . SOB (shortness of breath)   . Thyroid nodule     Current Outpatient Medications  Medication Sig Dispense Refill  . acetaminophen (TYLENOL) 650 MG CR tablet Take 1,300 mg by mouth every 8 (eight) hours as needed for pain.    Marland Kitchen  aspirin 81 MG tablet Take 81 mg by mouth daily.    . Calcium-Vitamin D-Vitamin K 500-500-40 MG-UNT-MCG CHEW Chew by mouth 2 (two) times daily.    . Cranberry 1000 MG CAPS Take 1,000 mg by mouth daily.    Marland Kitchen diltiazem (CARDIZEM CD) 240 MG 24 hr capsule TAKE 1 CAPSULE BY MOUTH EVERY DAY 90 capsule 3  . diphenhydrAMINE-Phenylephrine 6.25-2.5 MG/5ML LIQD Take by mouth as needed.     . furosemide (LASIX) 20 MG tablet Take 1 tablet (20 mg total) by mouth daily. 90 tablet 3  . KLOR-CON M10 10 MEQ tablet TAKE 1 TABLET BY MOUTH EVERY DAY 90 tablet 3  . lansoprazole (PREVACID) 30 MG capsule Take 30 mg by mouth 2 (two) times daily.    . macitentan (OPSUMIT) 10 MG tablet TAKE 1 TABLET BY MOUTH DAILY. DO NOT HANDLE IF PREGNANT. DO NOT SPLIT,CRUSH, OR CHEW. REVIEW MEDICATION GUIDE. 30 tablet 11  . rosuvastatin (CRESTOR) 10 MG tablet Take 1 tablet (10 mg total) by mouth daily. 90 tablet 3  . spironolactone (ALDACTONE) 25 MG tablet Take 0.5 tablets (12.5 mg total) by mouth daily. 45 tablet 3  . tadalafil, PAH, (ALYQ) 20 MG tablet Take 2 tablets (40 mg total) by mouth daily. 60 tablet 11  . vitamin E 180 MG (400 UNITS) capsule Take 400 Units by mouth daily.       No current facility-administered medications for this encounter.    PHYSICAL EXAM: Vitals:   05/14/20 1343  BP: (!) 160/70  Pulse: 96  SpO2: 98%  Weight: 73.5 kg (162 lb)   Wt Readings from Last 3 Encounters:  05/14/20 73.5 kg (162 lb)  11/17/19 74.8 kg (165 lb)  02/08/18 77.7 kg (171 lb 4 oz)   General:  Well appearing. No resp difficulty On O2 HEENT: normal Neck: supple. No JVD Carotids 2+ bilat; no bruits. No lymphadenopathy or thryomegaly appreciated. Cor: PMI nondisplaced. Regular rate & rhythm. No rubs, gallops  2/6 TR mildly increased with inspiration. P2 mildly increased Lungs: clear with decreased BS throughout Abdomen: obese, soft, nontender, nondistended. No hepatosplenomegaly. No bruits or masses. Good bowel  sounds. Extremities: no cyanosis, clubbing, rash, tr edema  + arthritic changes,telengectasias, and scleroderma changes to fingers Neuro: alert & oriented x3, cranial nerves grossly intact. moves all 4 extremities w/o difficulty. 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 continues to drop. Age and inactivity likely contributing but may also be progressive PH - If no better or worse at next visit, start selexipeg (she is borderline low- risk (  REVEAL score 7 pts) & 6MW<400 meters.) - Continue macitentain 10 mg daily. - Continue tadalafil 40 mg daily. - Encouraged to be more active - repeat Echo & in 6 months.  2. CREST syndrome  - stable. - no swallowing issues, continue PPI.  3. HTN - elevated today. - Increase Spiro to 25 mg daily - Continue diltiazem 240 mg daily. - Check BP at home and bring log to next appt. - repeat BMET in 7-10 days  4. Diastolic HF - stable NYHA I-II, volume ok on exam. - Increase spiro as above - Continue lasix 20 mg daily + 10 mEq KCl daily. - BMET today.  5. Chronic hypoxic respiratory failure - Followed by Dr. Delton Coombes. - Continue Home O2. - hi-RES CT 7/21: no ILD, fluid filled/dilated esophagus consistent with CREST, air-trapping, enlarged trunk indicative of pulm arterial htn - Has not followed back with Pulmonary  6. HL  - Continue statin. - check lipids today.  Arvilla Meres, MD  2:53 PM

## 2020-05-16 NOTE — Addendum Note (Signed)
Encounter addended by: Dolores Patty, MD on: 05/16/2020 5:26 PM  Actions taken: Level of Service modified, Visit diagnoses modified

## 2020-05-24 ENCOUNTER — Other Ambulatory Visit (HOSPITAL_COMMUNITY): Payer: Self-pay

## 2020-05-24 MED ORDER — OPSUMIT 10 MG PO TABS: ORAL_TABLET | ORAL | 11 refills | Status: DC

## 2020-06-28 ENCOUNTER — Telehealth (HOSPITAL_COMMUNITY): Payer: Self-pay | Admitting: Pharmacist

## 2020-06-28 NOTE — Telephone Encounter (Signed)
Received message from patient that a prior authorization for Opsumit was required. Attempted to submit Opsumit PA through [facility]. Was unable to submit PA as authorization was already on file for this request.   Authorization starting on 06/05/2020 and ending on 06/04/2021.  Karle Plumber, PharmD, BCPS, BCCP, CPP Heart Failure Clinic Pharmacist [phone]

## 2020-06-28 NOTE — Telephone Encounter (Signed)
Advanced Heart Failure Patient Advocate Encounter  Prior Authorization for tadalafil has been approved.    PA# [id number] Effective dates: 06/05/20 through 06/04/21  Karle Plumber, PharmD, BCPS, BCCP, CPP Heart Failure Clinic Pharmacist [phone]

## 2020-07-05 ENCOUNTER — Other Ambulatory Visit (HOSPITAL_COMMUNITY): Payer: Self-pay

## 2020-07-05 MED ORDER — TADALAFIL (PAH) 20 MG PO TABS: 40.0000 mg | ORAL_TABLET | Freq: Every day | ORAL | 11 refills | Status: DC

## 2020-11-25 ENCOUNTER — Ambulatory Visit (HOSPITAL_BASED_OUTPATIENT_CLINIC_OR_DEPARTMENT_OTHER)
Admission: RE | Admit: 2020-11-25 | Discharge: 2020-11-25 | Disposition: A | Discharge status: Home / Self Care | Payer: Medicare PPO | Source: Ambulatory Visit | Attending: Internal Medicine | Admitting: Internal Medicine

## 2020-11-25 ENCOUNTER — Other Ambulatory Visit: Payer: Self-pay

## 2020-11-25 ENCOUNTER — Ambulatory Visit (HOSPITAL_COMMUNITY)
Admission: RE | Admit: 2020-11-25 | Discharge: 2020-11-25 | Disposition: A | Discharge status: Home / Self Care | Payer: Medicare PPO | Source: Ambulatory Visit | Attending: Family Medicine | Admitting: Family Medicine

## 2020-11-25 VITALS — BP 168/70 | HR 78 | Ht 65.0 in | Wt 152.2 lb

## 2020-11-25 DIAGNOSIS — I272 Pulmonary hypertension, unspecified: Secondary | ICD-10-CM | POA: Diagnosis present

## 2020-11-25 DIAGNOSIS — M341 CR(E)ST syndrome: Secondary | ICD-10-CM | POA: Diagnosis not present

## 2020-11-25 DIAGNOSIS — J9611 Chronic respiratory failure with hypoxia: Secondary | ICD-10-CM | POA: Insufficient documentation

## 2020-11-25 DIAGNOSIS — I5032 Chronic diastolic (congestive) heart failure: Secondary | ICD-10-CM | POA: Diagnosis not present

## 2020-11-25 DIAGNOSIS — I5030 Unspecified diastolic (congestive) heart failure: Secondary | ICD-10-CM | POA: Insufficient documentation

## 2020-11-25 DIAGNOSIS — I10 Essential (primary) hypertension: Secondary | ICD-10-CM | POA: Diagnosis not present

## 2020-11-25 DIAGNOSIS — I2721 Secondary pulmonary arterial hypertension: Secondary | ICD-10-CM | POA: Diagnosis not present

## 2020-11-25 DIAGNOSIS — Z7982 Long term (current) use of aspirin: Secondary | ICD-10-CM | POA: Diagnosis not present

## 2020-11-25 DIAGNOSIS — I110 Hypertensive heart disease with heart failure: Secondary | ICD-10-CM | POA: Insufficient documentation

## 2020-11-25 DIAGNOSIS — K219 Gastro-esophageal reflux disease without esophagitis: Secondary | ICD-10-CM | POA: Diagnosis not present

## 2020-11-25 DIAGNOSIS — E785 Hyperlipidemia, unspecified: Secondary | ICD-10-CM | POA: Insufficient documentation

## 2020-11-25 DIAGNOSIS — Z79899 Other long term (current) drug therapy: Secondary | ICD-10-CM | POA: Insufficient documentation

## 2020-11-25 LAB — HEMOGLOBIN A1C
Hgb A1c MFr Bld: 4.8 % (ref 4.8–5.6)
Mean Plasma Glucose: 91.06 mg/dL

## 2020-11-25 LAB — CBC
HCT: 41.4 % (ref 36.0–46.0)
Hemoglobin: 13.8 g/dL (ref 12.0–15.0)
MCH: 35 pg — ABNORMAL HIGH (ref 26.0–34.0)
MCHC: 33.3 g/dL (ref 30.0–36.0)
MCV: 105.1 fL — ABNORMAL HIGH (ref 80.0–100.0)
Platelets: 215 10*3/uL (ref 150–400)
RBC: 3.94 MIL/uL (ref 3.87–5.11)
RDW: 13.2 % (ref 11.5–15.5)
WBC: 9.9 10*3/uL (ref 4.0–10.5)
nRBC: 0 % (ref 0.0–0.2)

## 2020-11-25 LAB — COMPREHENSIVE METABOLIC PANEL
ALT: 7 U/L (ref 0–44)
AST: 14 U/L — ABNORMAL LOW (ref 15–41)
Albumin: 3.9 g/dL (ref 3.5–5.0)
Alkaline Phosphatase: 56 U/L (ref 38–126)
Anion gap: 8 (ref 5–15)
BUN: 12 mg/dL (ref 8–23)
CO2: 27 mmol/L (ref 22–32)
Calcium: 9.8 mg/dL (ref 8.9–10.3)
Chloride: 103 mmol/L (ref 98–111)
Creatinine, Ser: 0.8 mg/dL (ref 0.44–1.00)
GFR, Estimated: >60 mL/min (ref >60)
Glucose, Bld: 110 mg/dL — ABNORMAL HIGH (ref 70–99)
Potassium: 4.1 mmol/L (ref 3.5–5.1)
Sodium: 138 mmol/L (ref 135–145)
Total Bilirubin: 0.6 mg/dL (ref 0.3–1.2)
Total Protein: 6.1 g/dL — ABNORMAL LOW (ref 6.5–8.1)

## 2020-11-25 LAB — ECHOCARDIOGRAM COMPLETE
Area-P 1/2: 2.62 cm2
S' Lateral: 2.7 cm

## 2020-11-25 LAB — TSH: TSH: 1.069 u[IU]/mL (ref 0.350–4.500)

## 2020-11-25 LAB — BRAIN NATRIURETIC PEPTIDE: B Natriuretic Peptide: 68.1 pg/mL (ref 0.0–100.0)

## 2020-11-25 NOTE — Progress Notes (Signed)
6 Min Walk Test Completed  Pt ambulated 396.2 meters O2 Sat ranged 90%-98% on 2 Liters  oxygen HR ranged 91-130

## 2020-11-25 NOTE — Progress Notes (Addendum)
Patient ID: Olivia Washington, female   DOB: November 18, 1945, 75 y.o.   MRN: [id number]  Advanced Heart Failure Clinic Note  Primary Care: Dr. Anda Latina Rheumatologist: Dr. Dierdre Forth ENT: Dr. Annalee Genta  HF Cardiologist: Dr. Gala Romney  HPI:  [name] is a 75  y.o. 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 Non-smoker.  In 1/18 DLCO was down so we did hi-res CT. No ILD. Mosaic pattern c/w pHTN.  Today she returns for [facility] follow up. Feels good. Now living in [place]. Continues on 2L O2. Not overly active but can do ADLs fine. Walks in the driveway. Doesn't go to store due to COVID concerns. No edema, orthopnea or PND. No syncope or presyncope. Has lost 10 pounds. SBP 120-130s at home.   Echo today 11/25/20: EF 60-65% RV normal. RVSP IVC small Personally reviewed   Echo 6/21 EF 65% RV normal. No evidence PAH. IVC small Personally reviewed Echo 9/19 EF 60% RV normal Trivial TR RVSP ~67mmHG. IVC small. Personally reviewed Echo 4/18 EF 60% RV normal IVC normal RVSP Echo 12/29/15 EF 55-60% RV normal. Trivial TR RVSP 35-40 IVC normal    PFTs 11/17/19 FVC 3.14 (101%) FEV 2.08 (89%) FEV1/FVC 66% DLCO 48%   PFTs 05/25/16  FVC 3.17 (99%) FEV 2.30 (95%) FEV1/FVC 72 TLCO 4.01 (77%) DLCO 9.65 (37%)  PH Work up:  TTE 05/13/12: LVEF 55-60%. Grade 1 diastolic dysfunction. Ventricular septum with diastolic flattening. LA mildly dilated. RV mildly dilated with systolic function mod reduced. RA mildly dilated. Atrial septum bowed right to left. PAPP 54 mmHg.  CT scan chest 12/3/130 showed no ILD, no acute or chronic PE but some dilation of the PA's. Left thyroid enlargement with rightward tracheal deviation (ENT eval pending)  VQ scan 05/27/12: normal, no PE   RHC 05/24/12  RA: 11/8/6  RV: 86/7  PA: 78/27 (47) PCWP: 8/8/5   Cardiac Output  Thermodilution: 3.6 with index of  1.9  Fick : 5.9 with index of 3.1  11.6 woods units  At final adenosine dose  76/28 (46)  CO (thermo) = 4.3 with index of 2.3  9.5 Woods units  LHC: normal cors   PFTs and sleep study completed  07/03/12 Sleep study 1. Small numbers of obstructive events which do not meet the AHI  criteria for the obstructive sleep apnea syndrome.  2. Transient REM associated desaturation as low as 82% during the  night. However, the patient only spent 23 minutes the entire night  less than 88%  06/22/12 1150 ft, O2 sat did drop to 82% on RA and pt was placed on 2L O2 via [place] and O2 sat improved to 92%.  11/14: 6 min walk test completed, pt ambulated 1110 ft (338 m), O2 sat ranged from 89-99 % on 2L, HR ranged from 84-113 1160 ft on O2 6L 09/23/13 1000 ft sats 88% 2L. (different person walking with her)  7/15  1350 feet 87-100%  4/16 1200 feet, 366 m 8/16 1340 feet, 408 m  7/17  1480 feet 46m on 2L O2 sat nadir at 88%  09/27/16 1240 ft (378 m) on 2L O2 sats ranged 85-96%, HR ranged 98-123. 11/22/16 1220 ft (337m) 10/08/17 1290 ft (369m) 05/14/20: on 2L O2 305 meters, O2 on 2L~90-92%, HR 91-137 bpm 11/25/20: on 2L 396.2 meters O2 Sat ranged 90%-98%  ROS: All  systems negative except as listed in HPI, PMH and Problem List.  Past Medical History:  Diagnosis Date   Allergy history unknown    CHF (congestive heart failure) (HCC)    CREST (calcinosis, Raynaud's phenomenon, esophageal dysfunction, sclerodactyly, telangiectasia) (HCC)    CREST syndrome (HCC)    GERD (gastroesophageal reflux disease)    Heart palpitations    Hyperlipidemia    Hypertension    Hypertension    Migraine    Osteoarthrosis    unspecified whether generalized or localized   Pulmonary hypertension (HCC)    SOB (shortness of breath)    Thyroid nodule     Current Outpatient Medications  Medication Sig Dispense Refill   acetaminophen (TYLENOL) 650 MG CR tablet Take 1,300 mg by  mouth every 8 (eight) hours as needed for pain.     aspirin 81 MG tablet Take 81 mg by mouth daily.     Calcium-Vitamin D-Vitamin K 500-500-40 MG-UNT-MCG CHEW Chew by mouth 2 (two) times daily.     Cranberry 1000 MG CAPS Take 1,000 mg by mouth daily.     diltiazem (CARDIZEM CD) 240 MG 24 hr capsule TAKE 1 CAPSULE BY MOUTH EVERY DAY 90 capsule 3   diphenhydrAMINE-Phenylephrine 6.25-2.5 MG/5ML LIQD Take by mouth as needed.      furosemide (LASIX) 20 MG tablet Take 1 tablet (20 mg total) by mouth daily. 90 tablet 3   KLOR-CON M10 10 MEQ tablet TAKE 1 TABLET BY MOUTH EVERY DAY 90 tablet 3   lansoprazole (PREVACID) 30 MG capsule Take 30 mg by mouth 2 (two) times daily.     macitentan (OPSUMIT) 10 MG tablet TAKE 1 TABLET BY MOUTH DAILY. DO NOT HANDLE IF PREGNANT. DO NOT SPLIT,CRUSH, OR CHEW. REVIEW MEDICATION GUIDE. 30 tablet 11   rosuvastatin (CRESTOR) 10 MG tablet Take 1 tablet (10 mg total) by mouth daily. 90 tablet 3   spironolactone (ALDACTONE) 25 MG tablet Take 1 tablet (25 mg total) by mouth daily. 90 tablet 3   tadalafil, PAH, (ALYQ) 20 MG tablet Take 2 tablets (40 mg total) by mouth daily. 60 tablet 11   vitamin E 180 MG (400 UNITS) capsule Take 400 Units by mouth daily.       No current facility-administered medications for this encounter.    PHYSICAL EXAM: Vitals:   11/25/20 1027  BP: (!) 168/70  Pulse: 78  SpO2: 99%  Weight: 69 kg (152 lb 3.2 oz)  Height: 5\' 5"  (1.651 m)   Wt Readings from Last 3 Encounters:  11/25/20 69 kg (152 lb 3.2 oz)  05/14/20 73.5 kg (162 lb)  11/17/19 74.8 kg (165 lb)   General:  Well appearing. No resp difficulty On O2 HEENT: normal Neck: supple. no JVD. Carotids 2+ bilat; no bruits. No lymphadenopathy or thryomegaly appreciated. Cor: PMI nondisplaced. Regular rate & rhythm. No rubs, gallops or murmurs. Lungs: clear Abdomen: soft, nontender, nondistended. No hepatosplenomegaly. No bruits or masses. Good bowel sounds. Extremities: no cyanosis,  clubbing, rash, edema + joint deformities Neuro: alert & orientedx3, cranial nerves grossly intact. moves all 4 extremities w/o difficulty. 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 RVSP 11/27/20 IVC small Personally reviewed - Doing well. NYHA II though remains sedentary. - Continue macitentain 10 mg daily. - Continue tadalafil 40 mg daily. - Remains low risk. Will  not add selexipag at this time. Check labs including BNP for further risk stratification - Encouraged to be more active  2. CREST syndrome  - stable. - no swallowing issues, continue PPI.  3. HTN - elevated in clinic. Well controlled at home. - Continue spiro 25 mg daily - Continue diltiazem 240 mg daily. - Labs today  4. Diastolic HF - stable NYHA I-II, volume ok on exam. - Continue lasix 20 mg daily + 10 mEq KCl daily.  5. Chronic hypoxic respiratory failure - Followed by Dr. Delton Coombes. - Continue Home O2. - hi-RES CT 7/21: no ILD, fluid filled/dilated esophagus consistent with CREST, air-trapping, enlarged trunk indicative of pulm arterial htn - Has not followed back with Pulmonary  6. HL  - Continue statin.  Arvilla Meres, MD  11:30 AM

## 2020-11-25 NOTE — Patient Instructions (Signed)
Labs drawn today; will call you if any abnormailites. Call Heart Failure Clinic in December to schedule you 6 month follow up appointment with Dr. Gala Romney in [facility] Failure Clinic.

## 2021-01-11 ENCOUNTER — Other Ambulatory Visit (HOSPITAL_COMMUNITY): Payer: Self-pay | Admitting: Internal Medicine

## 2021-01-20 ENCOUNTER — Other Ambulatory Visit (HOSPITAL_COMMUNITY): Payer: Self-pay | Admitting: Internal Medicine

## 2021-02-02 ENCOUNTER — Other Ambulatory Visit (HOSPITAL_COMMUNITY): Payer: Self-pay | Admitting: Internal Medicine

## 2021-02-18 ENCOUNTER — Other Ambulatory Visit (HOSPITAL_COMMUNITY): Payer: Self-pay

## 2021-03-14 ENCOUNTER — Other Ambulatory Visit (HOSPITAL_COMMUNITY): Payer: Self-pay | Admitting: Internal Medicine

## 2021-04-11 ENCOUNTER — Other Ambulatory Visit (HOSPITAL_COMMUNITY): Payer: Self-pay | Admitting: Internal Medicine

## 2021-05-10 ENCOUNTER — Other Ambulatory Visit (HOSPITAL_COMMUNITY): Payer: Self-pay

## 2021-05-10 MED ORDER — OPSUMIT 10 MG PO TABS: ORAL_TABLET | ORAL | 11 refills | Status: DC

## 2021-05-13 ENCOUNTER — Telehealth (HOSPITAL_COMMUNITY): Payer: Self-pay | Admitting: Pharmacist

## 2021-05-13 ENCOUNTER — Other Ambulatory Visit (HOSPITAL_COMMUNITY): Payer: Self-pay

## 2021-05-13 NOTE — Telephone Encounter (Signed)
Advanced Heart Failure Patient Advocate Encounter  Tried to submit prior authorization for Opsumit. Received message through CoverMyMeds:   Authorization already on file for this request. Authorization starting on 06/05/2020 and ending on 06/04/2022.  Karle Plumber, PharmD, BCPS, BCCP, CPP Heart Failure Clinic Pharmacist [phone]

## 2021-05-13 NOTE — Telephone Encounter (Signed)
Advanced Heart Failure Patient Advocate Encounter  Prior Authorization for tadalafil has been approved.    PA# [id number] Effective dates: 06/05/20 through 06/04/22  Karle Plumber, PharmD, BCPS, BCCP, CPP Heart Failure Clinic Pharmacist [phone]

## 2021-06-03 ENCOUNTER — Other Ambulatory Visit (HOSPITAL_COMMUNITY): Payer: Self-pay

## 2021-06-03 MED ORDER — TADALAFIL (PAH) 20 MG PO TABS: 40.0000 mg | ORAL_TABLET | Freq: Every day | ORAL | 11 refills | Status: DC

## 2021-07-13 ENCOUNTER — Encounter (HOSPITAL_COMMUNITY): Payer: Self-pay | Admitting: Internal Medicine

## 2021-07-13 ENCOUNTER — Ambulatory Visit (HOSPITAL_COMMUNITY)
Admission: RE | Admit: 2021-07-13 | Discharge: 2021-07-13 | Disposition: A | Discharge status: Home / Self Care | Payer: Medicare PPO | Source: Ambulatory Visit | Attending: Internal Medicine | Admitting: Internal Medicine

## 2021-07-13 ENCOUNTER — Other Ambulatory Visit: Payer: Self-pay

## 2021-07-13 VITALS — BP 130/60 | HR 78 | Wt 148.6 lb

## 2021-07-13 DIAGNOSIS — I110 Hypertensive heart disease with heart failure: Secondary | ICD-10-CM | POA: Insufficient documentation

## 2021-07-13 DIAGNOSIS — I5030 Unspecified diastolic (congestive) heart failure: Secondary | ICD-10-CM | POA: Insufficient documentation

## 2021-07-13 DIAGNOSIS — I2721 Secondary pulmonary arterial hypertension: Secondary | ICD-10-CM | POA: Diagnosis not present

## 2021-07-13 DIAGNOSIS — I10 Essential (primary) hypertension: Secondary | ICD-10-CM | POA: Diagnosis not present

## 2021-07-13 DIAGNOSIS — E785 Hyperlipidemia, unspecified: Secondary | ICD-10-CM | POA: Insufficient documentation

## 2021-07-13 DIAGNOSIS — I272 Pulmonary hypertension, unspecified: Secondary | ICD-10-CM

## 2021-07-13 DIAGNOSIS — J9611 Chronic respiratory failure with hypoxia: Secondary | ICD-10-CM | POA: Diagnosis not present

## 2021-07-13 DIAGNOSIS — Z79899 Other long term (current) drug therapy: Secondary | ICD-10-CM | POA: Diagnosis not present

## 2021-07-13 DIAGNOSIS — M341 CR(E)ST syndrome: Secondary | ICD-10-CM | POA: Diagnosis not present

## 2021-07-13 DIAGNOSIS — K219 Gastro-esophageal reflux disease without esophagitis: Secondary | ICD-10-CM | POA: Diagnosis not present

## 2021-07-13 NOTE — Progress Notes (Signed)
Patient ID: Olivia Washington, female   DOB: [birth year], 76 y.o.   MRN: [id number]  Advanced Heart Failure Clinic Note  Primary Care: Olivia Washington Rheumatologist: Olivia Washington ENT: Olivia Washington  HF Cardiologist: Olivia Washington  HPI:   Olivia Washington is a 76 y.o. 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 Non-smoker.  In 1/18 DLCO was down so we did hi-res CT. No ILD. Mosaic pattern c/w pHTN.  Today [facility] returns for [facility]. Feels good. Now living in [place]. Continues on 2L O2. Not overly active but can do ADLs fine. Only complaint is chronic sinus pain. No edema, orthopnea or PND. No problems with meds. No dizziness. Has lost 14 pounds in 1 year due to cutting out ice cream.  Echo  11/25/20: EF 60-65% RV normal. RVSP IVC small Personally reviewed   Echo 6/21 EF 65% RV normal. No evidence PAH. IVC small Echo 9/19 EF 60% RV normal Trivial TR RVSP ~1mmHG. IVC small.  Echo 4/18 EF 60% RV normal IVC normal RVSP Echo 12/29/15 EF 55-60% RV normal. Trivial TR RVSP 35-40 IVC normal    RHC 11/18 RA = 2 RV = 31/3 PA = 35/10 (22) PCW = 8 Fick cardiac output/index = 9.6/5.2 PVR = 1.5 Ao sat = 100% PA sat = 80%, 80% High SVC = 82%  PFTs 11/17/19 FVC 3.14 (101%) FEV 2.08 (89%) FEV1/FVC 66% DLCO 48%   PFTs 05/25/16  FVC 3.17 (99%) FEV 2.30 (95%) FEV1/FVC 72 TLCO 4.01 (77%) DLCO 9.65 (37%)  PH Work up:  TTE 05/13/12: LVEF 55-60%. Grade 1 diastolic dysfunction. Ventricular septum with diastolic flattening. LA mildly dilated. RV mildly dilated with systolic function mod reduced. RA mildly dilated. Atrial septum bowed right to left. PAPP 54 mmHg.  CT scan chest 12/3/130 showed no ILD, no acute or chronic PE but some dilation of the PA's. Left thyroid enlargement with rightward tracheal deviation (ENT eval pending)  VQ scan 05/27/12: normal, no PE   RHC  05/24/12  RA: 11/8/6  RV: 86/7  PA: 78/27 (47) PCWP: 8/8/5   Cardiac Output  Thermodilution: 3.6 with index of 1.9  Fick : 5.9 with index of 3.1  11.6 woods units  At final adenosine dose  76/28 (46)  CO (thermo) = 4.3 with index of 2.3  9.5 Woods units  LHC: normal cors    06/22/12 1150 ft, O2 sat did drop to 82% on RA and pt was placed on 2L O2 via [place] and O2 sat improved to 92%.  11/14: 6 min walk test completed, pt ambulated 1110 ft (338 m), O2 sat ranged from 89-99 % on 2L, HR ranged from 84-113 1160 ft on O2 6L 09/23/13 1000 ft sats 88% 2L. (different person walking with her)  7/15  1350 feet 87-100%  4/16 1200 feet, 366 m 8/16 1340 feet, 408 m  7/17  1480 feet 466m on 2L O2 sat nadir at 88%  09/27/16 1240 ft (378 m) on 2L O2 sats ranged 85-96%, HR ranged 98-123. 11/22/16 1220 ft (327m) 10/08/17 1290 ft (344m) 05/14/20: on 2L O2 305 meters, O2 on 2L~90-92%, HR 91-137 bpm 11/25/20: on 2L 396.2 meters O2 Sat ranged 90%-98%  ROS: All systems negative except as listed in HPI, PMH and Problem List.  Past Medical History:  Diagnosis Date   Allergy history unknown  CHF (congestive heart failure) (HCC)    CREST (calcinosis, Raynaud's phenomenon, esophageal dysfunction, sclerodactyly, telangiectasia) (HCC)    CREST syndrome (HCC)    GERD (gastroesophageal reflux disease)    Heart palpitations    Hyperlipidemia    Hypertension    Hypertension    Migraine    Osteoarthrosis    unspecified whether generalized or localized   Pulmonary hypertension (HCC)    SOB (shortness of breath)    Thyroid nodule     Current Outpatient Medications  Medication Sig Dispense Refill   acetaminophen (TYLENOL) 650 MG CR tablet Take 1,300 mg by mouth every 8 (eight) hours as needed for pain.     aspirin 81 MG tablet Take 81 mg by mouth daily.     Calcium-Vitamin D-Vitamin K 500-100-40 MG-UNT-MCG CHEW Chew 2 tablets by mouth daily in the afternoon.      Cranberry 1000 MG CAPS Take 1,000 mg by mouth daily.     diltiazem (CARDIZEM CD) 240 MG 24 hr capsule TAKE 1 CAPSULE BY MOUTH EVERY DAY 90 capsule 3   diphenhydrAMINE-Phenylephrine 6.25-2.5 MG/5ML LIQD Take by mouth as needed.      furosemide (LASIX) 20 MG tablet TAKE 1 TABLET BY MOUTH EVERY DAY 90 tablet 3   KLOR-CON M10 10 MEQ tablet TAKE 1 TABLET BY MOUTH EVERY DAY 90 tablet 3   lansoprazole (PREVACID) 30 MG capsule Take 30 mg by mouth 2 (two) times daily.     macitentan (OPSUMIT) 10 MG tablet TAKE 1 TABLET BY MOUTH DAILY. DO NOT HANDLE IF PREGNANT. DO NOT SPLIT,CRUSH, OR CHEW. REVIEW MEDICATION GUIDE. 30 tablet 11   rosuvastatin (CRESTOR) 10 MG tablet TAKE 1 TABLET BY MOUTH EVERY DAY 90 tablet 3   spironolactone (ALDACTONE) 25 MG tablet TAKE 1 TABLET EVERY DAY 90 tablet 0   tadalafil, PAH, (ADCIRCA) 20 MG tablet Take 20 mg by mouth 2 (two) times daily.     vitamin E 180 MG (400 UNITS) capsule Take 400 Units by mouth daily.       No current facility-administered medications for this encounter.    PHYSICAL EXAM: Vitals:   07/13/21 1113  BP: 130/60  Pulse: 78  SpO2: 99%  Weight: 67.4 kg (148 lb 9.6 oz)   Wt Readings from Last 3 Encounters:  07/13/21 67.4 kg (148 lb 9.6 oz)  11/25/20 69 kg (152 lb 3.2 oz)  05/14/20 73.5 kg (162 lb)   General:  Well appearing. No resp difficulty on 2L  O2 HEENT: normal Neck: supple. no JVD. Carotids 2+ bilat; no bruits. No lymphadenopathy or thryomegaly appreciated. Cor: PMI nondisplaced. Regular rate & rhythm. No rubs, gallops or murmurs. Lungs: clear Abdomen: soft, nontender, nondistended. No hepatosplenomegaly. No bruits or masses. Good bowel sounds. Extremities: no cyanosis, clubbing, rash, edema Neuro: alert & orientedx3, cranial nerves grossly intact. moves all 4 extremities w/o difficulty. 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 RVSP IVC small Personally reviewed - Doing well. NYHA II though remains sedentary. - Reveal Lite score 5 (low risk)  - Continue macitentain 10 mg daily. - Continue tadalafil 40 mg daily. - Remains low risk. Will not add selexipag at this time. Check labs including BNP for further risk stratification - Encouraged to be more active - Repeat echo and at next visit  2. CREST syndrome  -  stable. - no swallowing issues, continue PPI.  3. HTN - Blood pressure well controlled. Continue current regimen. - Continue spiro 25 mg daily - Continue diltiazem 240 mg daily.  4. Diastolic HF - stable NYHA I-II, volume status ok - Continue lasix 20 mg daily + 10 mEq KCl daily.  5. Chronic hypoxic respiratory failure - Followed by Dr. Delton Coombes. - Continue Home O2. - hi-RES CT 7/21: no ILD, fluid filled/dilated esophagus consistent with CREST, air-trapping, enlarged trunk indicative of pulm arterial htn - Has not followed back with Pulmonary  6. HL  - Continue statin - Followed by PCP. Continue statin  Arvilla Meres, MD  11:27 AM

## 2021-07-13 NOTE — Patient Instructions (Signed)
Thank you for your visit today.   There has been no changes to your medications.   Your physician has requested that you have an echocardiogram. Echocardiography is a painless test that uses sound waves to create images of your heart. It provides your doctor with information about the size and shape of your heart and how well your hearts chambers and valves are working. This procedure takes approximately one hour. There are no restrictions for this procedure.  Your physician recommends that you schedule a follow-up appointment in: 6 months (August 2023)  ** call the office in June to schedule your follow up   If you have any questions or concerns before your next appointment please send [place] a message through [place] or call our office at [phone].    TO LEAVE A MESSAGE FOR THE NURSE SELECT OPTION 2, PLEASE LEAVE A MESSAGE INCLUDING: YOUR NAME DATE OF BIRTH CALL BACK NUMBER REASON FOR CALL**this is important as we prioritize the call backs  YOU WILL RECEIVE A CALL BACK THE SAME DAY AS LONG AS YOU CALL BEFORE 4:00 PM  At the [facility], you and your health needs are our priority. As part of our continuing mission to provide you with exceptional heart care, we have created designated Provider Care Teams. These Care Teams include your primary Cardiologist (physician) and Advanced Practice Providers (APPs- Physician Assistants and Nurse Practitioners) who all work together to provide you with the care you need, when you need it.   You may see any of the following providers on your designated Care Team at your next follow up: Dr Glori Bickers Dr Haynes Kerns, NP Lyda Jester, [place], [place] Audry Riles, PharmD   Please be sure to bring in all your medications bottles to every appointment.

## 2021-09-04 ENCOUNTER — Other Ambulatory Visit (HOSPITAL_COMMUNITY): Payer: Self-pay | Admitting: Internal Medicine

## 2022-01-05 ENCOUNTER — Other Ambulatory Visit (HOSPITAL_COMMUNITY): Payer: Self-pay

## 2022-01-05 MED ORDER — DILTIAZEM HCL ER COATED BEADS 240 MG PO CP24: 240.0000 mg | ORAL_CAPSULE | Freq: Every day | ORAL | 3 refills | Status: DC

## 2022-01-05 MED ORDER — ROSUVASTATIN CALCIUM 10 MG PO TABS: 10.0000 mg | ORAL_TABLET | Freq: Every day | ORAL | 3 refills | Status: DC

## 2022-01-05 MED ORDER — POTASSIUM CHLORIDE CRYS ER 10 MEQ PO TBCR: 10.0000 meq | EXTENDED_RELEASE_TABLET | Freq: Every day | ORAL | 3 refills | Status: DC

## 2022-01-05 MED ORDER — FUROSEMIDE 20 MG PO TABS: 20.0000 mg | ORAL_TABLET | Freq: Every day | ORAL | 3 refills | Status: DC

## 2022-01-15 NOTE — H&P (View-Only) (Signed)
Patient ID: Olivia Washington, female   DOB: [birth year], 76 y.o.   MRN: [id number]  Advanced Heart Failure Clinic Note  Primary Care: Dr. Anda Latina Rheumatologist: Dr. Dierdre Forth ENT: Dr. Annalee Genta  HF Cardiologist: Dr. 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 Non-smoker.  In 1/18 DLCO was down so we did hi-res CT. No ILD. Mosaic pattern c/w pHTN.  Today she returns for [place] follow up. Feels good. Now living in [place]. Continues on 2L O2. Not overly active but can do ADLs fine. Says other than having HAs she is ok. Breathing stable. No edema, orthopnea or PND. No syncope or presyncope.   Echo today 01/17/12: EF 60-65% RV function normal RVSP IVC small Personally reviewed  Echo  11/25/20: EF 60-65% RV normal. RVSP IVC small   Echo 6/21 EF 65% RV normal. No evidence PAH. IVC small Echo 9/19 EF 60% RV normal Trivial TR RVSP ~80mmHG. IVC small.  Echo 4/18 EF 60% RV normal IVC normal RVSP Echo 12/29/15 EF 55-60% RV normal. Trivial TR RVSP 35-40 IVC normal    RHC 11/18 RA = 2 RV = 31/3 PA = 35/10 (22) PCW = 8 Fick cardiac output/index = 9.6/5.2 PVR = 1.5 Ao sat = 100% PA sat = 80%, 80% High SVC = 82%  PFTs 11/17/19 FVC 3.14 (101%) FEV 2.08 (89%) FEV1/FVC 66% DLCO 48%   PFTs 05/25/16  FVC 3.17 (99%) FEV 2.30 (95%) FEV1/FVC 72 TLCO 4.01 (77%) DLCO 9.65 (37%)  PH Work up:  TTE 05/13/12: LVEF 55-60%. Grade 1 diastolic dysfunction. Ventricular septum with diastolic flattening. LA mildly dilated. RV mildly dilated with systolic function mod reduced. RA mildly dilated. Atrial septum bowed right to left. PAPP 54 mmHg.  CT scan chest 12/3/130 showed no ILD, no acute or chronic PE but some dilation of the PA's. Left thyroid enlargement with rightward tracheal deviation (ENT eval pending)  VQ scan 05/27/12: normal,  no PE   RHC 05/24/12  RA: 11/8/6  RV: 86/7  PA: 78/27 (47) PCWP: 8/8/5   Cardiac Output  Thermodilution: 3.6 with index of 1.9  Fick : 5.9 with index of 3.1  11.6 woods units  At final adenosine dose  76/28 (46)  CO (thermo) = 4.3 with index of 2.3  9.5 Woods units  LHC: normal cors    06/22/12 1150 ft, O2 sat did drop to 82% on RA and pt was placed on 2L O2 via [place] and O2 sat improved to 92%.  11/14: 6 min walk test completed, pt ambulated 1110 ft (338 m), O2 sat ranged from 89-99 % on 2L, HR ranged from 84-113 1160 ft on O2 6L 09/23/13 1000 ft sats 88% 2L. (different person walking with her)  7/15  1350 feet 87-100%  4/16 1200 feet, 366 m 8/16 1340 feet, 408 m  7/17  1480 feet 472m on 2L O2 sat nadir at 88%  09/27/16 1240 ft (378 m) on 2L O2 sats ranged 85-96%, HR ranged 98-123. 11/22/16 1220 ft (387m) 10/08/17 1290 ft (39m) 05/14/20: on 2L O2 305 meters, O2 on 2L~90-92%, HR 91-137 bpm 11/25/20: on 2L 396.2 meters O2 Sat ranged 90%-98%  ROS: All systems negative except as listed in HPI, PMH and Problem List.  Past Medical History:  Diagnosis Date  Allergy history unknown    CHF (congestive heart failure) (HCC)    CREST (calcinosis, Raynaud's phenomenon, esophageal dysfunction, sclerodactyly, telangiectasia) (HCC)    CREST syndrome (HCC)    GERD (gastroesophageal reflux disease)    Heart palpitations    Hyperlipidemia    Hypertension    Hypertension    Migraine    Osteoarthrosis    unspecified whether generalized or localized   Pulmonary hypertension (HCC)    SOB (shortness of breath)    Thyroid nodule     Current Outpatient Medications  Medication Sig Dispense Refill   aspirin 81 MG tablet Take 81 mg by mouth daily.     Calcium-Vitamin D-Vitamin K 500-100-40 MG-UNT-MCG CHEW Chew 2 tablets by mouth daily in the afternoon.     Cranberry 1000 MG CAPS Take 1,000 mg by mouth daily.     diltiazem (CARDIZEM CD) 240 MG 24  hr capsule Take 1 capsule (240 mg total) by mouth daily. 90 capsule 3   diphenhydrAMINE-Phenylephrine 6.25-2.5 MG/5ML LIQD Take by mouth as needed.      furosemide (LASIX) 20 MG tablet Take 1 tablet (20 mg total) by mouth daily. 90 tablet 3   lansoprazole (PREVACID) 30 MG capsule Take 30 mg by mouth 2 (two) times daily.     macitentan (OPSUMIT) 10 MG tablet TAKE 1 TABLET BY MOUTH DAILY. DO NOT HANDLE IF PREGNANT. DO NOT SPLIT,CRUSH, OR CHEW. REVIEW MEDICATION GUIDE. 30 tablet 11   potassium chloride (KLOR-CON M10) 10 MEQ tablet Take 1 tablet (10 mEq total) by mouth daily. 90 tablet 3   rosuvastatin (CRESTOR) 10 MG tablet Take 1 tablet (10 mg total) by mouth daily. 90 tablet 3   spironolactone (ALDACTONE) 25 MG tablet TAKE 1 TABLET EVERY DAY 90 tablet 0   tadalafil, PAH, (ADCIRCA) 20 MG tablet Take 20 mg by mouth 2 (two) times daily.     vitamin E 180 MG (400 UNITS) capsule Take 400 Units by mouth daily.       No current facility-administered medications for this encounter.    PHYSICAL EXAM: Vitals:   01/16/22 1026  BP: (!) 144/60  Pulse: 75  SpO2: 100%  Weight: 68.6 kg (151 lb 3.2 oz)    Wt Readings from Last 3 Encounters:  01/16/22 68.6 kg (151 lb 3.2 oz)  07/13/21 67.4 kg (148 lb 9.6 oz)  11/25/20 69 kg (152 lb 3.2 oz)   General:  Well appearing. No resp difficulty on 2L O2 HEENT: normal telengectasias Neck: supple. no JVD. Carotids 2+ bilat; no bruits. No lymphadenopathy or thryomegaly appreciated. Cor: PMI nondisplaced. Regular rate & rhythm. 2/6 TR Lungs: clear Abdomen: soft, nontender, nondistended. No hepatosplenomegaly. No bruits or masses. Good bowel sounds. Extremities: no cyanosis, clubbing, rash, edema Neuro: alert & orientedx3, cranial nerves grossly intact. moves all 4 extremities w/o difficulty. Affect pleasant intact. moves all 4 extremities w/o difficulty. 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 RVSP 37mmHG IVC small  - Echo today 01/17/12: EF 60-65% RV function normal RVSP 37mmHG IVC small Personally reviewed - Doing well. NYHA II though remains sedentary. - Reveal Lite score 5 (low risk)  - Continue macitentain 10 mg daily. - Continue tadalafil 40 mg daily. - Now intermediate risk. BNP 68 - Encouraged to be more active - 6MW today - will plan RHC to   reassess and see if she is a candidate for selexipag  2. CREST syndrome  - stable. - no swallowing issues, continue PPI.  3. HTN - Blood pressure realtively well controlled. Continue current regimen. - Continue spiro 25 mg daily - Continue diltiazem 240 mg daily.  4. Diastolic HF - stable NYHA II, volume status ok - Continue lasix 20 mg daily + 10 mEq KCl daily.  5. Chronic hypoxic respiratory failure - Followed by Dr. Delton Coombes. - Continue Home O2. - hi-RES CT 7/21: no ILD, fluid filled/dilated esophagus consistent with CREST, air-trapping, enlarged trunk indicative of pulm arterial htn - Has not followed back with Pulmonary  6. HL  - Continue statin - check lipids today   Arvilla Meres, MD  10:37 AM

## 2022-01-15 NOTE — Progress Notes (Signed)
Patient ID: THAILA BOTTOMS, female   DOB: [birth year], 76 y.o.   MRN: [id number]  Advanced Heart Failure Clinic Note  Primary Care: Dr. Anda Latina Rheumatologist: Dr. Dierdre Forth ENT: Dr. Annalee Genta  HF Cardiologist: Dr. Gala Romney  HPI:   Ms. 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 Non-smoker.  In 1/18 DLCO was down so we did hi-res CT. No ILD. Mosaic pattern c/w pHTN.  Today she returns for [place] follow up. Feels good. Now living in [place]. Continues on 2L O2. Not overly active but can do ADLs fine. Says other than having HAs she is ok. Breathing stable. No edema, orthopnea or PND. No syncope or presyncope.   Echo today 01/17/12: EF 60-65% RV function normal RVSP IVC small Personally reviewed  Echo  11/25/20: EF 60-65% RV normal. RVSP IVC small   Echo 6/21 EF 65% RV normal. No evidence PAH. IVC small Echo 9/19 EF 60% RV normal Trivial TR RVSP ~80mmHG. IVC small.  Echo 4/18 EF 60% RV normal IVC normal RVSP Echo 12/29/15 EF 55-60% RV normal. Trivial TR RVSP 35-40 IVC normal    RHC 11/18 RA = 2 RV = 31/3 PA = 35/10 (22) PCW = 8 Fick cardiac output/index = 9.6/5.2 PVR = 1.5 Ao sat = 100% PA sat = 80%, 80% High SVC = 82%  PFTs 11/17/19 FVC 3.14 (101%) FEV 2.08 (89%) FEV1/FVC 66% DLCO 48%   PFTs 05/25/16  FVC 3.17 (99%) FEV 2.30 (95%) FEV1/FVC 72 TLCO 4.01 (77%) DLCO 9.65 (37%)  PH Work up:  TTE 05/13/12: LVEF 55-60%. Grade 1 diastolic dysfunction. Ventricular septum with diastolic flattening. LA mildly dilated. RV mildly dilated with systolic function mod reduced. RA mildly dilated. Atrial septum bowed right to left. PAPP 54 mmHg.  CT scan chest 12/3/130 showed no ILD, no acute or chronic PE but some dilation of the PA's. Left thyroid enlargement with rightward tracheal deviation (ENT eval pending)  VQ scan 05/27/12: normal,  no PE   RHC 05/24/12  RA: 11/8/6  RV: 86/7  PA: 78/27 (47) PCWP: 8/8/5   Cardiac Output  Thermodilution: 3.6 with index of 1.9  Fick : 5.9 with index of 3.1  11.6 woods units  At final adenosine dose  76/28 (46)  CO (thermo) = 4.3 with index of 2.3  9.5 Woods units  LHC: normal cors    06/22/12 1150 ft, O2 sat did drop to 82% on RA and pt was placed on 2L O2 via [place] and O2 sat improved to 92%.  11/14: 6 min walk test completed, pt ambulated 1110 ft (338 m), O2 sat ranged from 89-99 % on 2L, HR ranged from 84-113 1160 ft on O2 6L 09/23/13 1000 ft sats 88% 2L. (different person walking with her)  7/15  1350 feet 87-100%  4/16 1200 feet, 366 m 8/16 1340 feet, 408 m  7/17  1480 feet 472m on 2L O2 sat nadir at 88%  09/27/16 1240 ft (378 m) on 2L O2 sats ranged 85-96%, HR ranged 98-123. 11/22/16 1220 ft (387m) 10/08/17 1290 ft (39m) 05/14/20: on 2L O2 305 meters, O2 on 2L~90-92%, HR 91-137 bpm 11/25/20: on 2L 396.2 meters O2 Sat ranged 90%-98%  ROS: All systems negative except as listed in HPI, PMH and Problem List.  Past Medical History:  Diagnosis Date  Allergy history unknown    CHF (congestive heart failure) (HCC)    CREST (calcinosis, Raynaud's phenomenon, esophageal dysfunction, sclerodactyly, telangiectasia) (HCC)    CREST syndrome (HCC)    GERD (gastroesophageal reflux disease)    Heart palpitations    Hyperlipidemia    Hypertension    Hypertension    Migraine    Osteoarthrosis    unspecified whether generalized or localized   Pulmonary hypertension (HCC)    SOB (shortness of breath)    Thyroid nodule     Current Outpatient Medications  Medication Sig Dispense Refill   aspirin 81 MG tablet Take 81 mg by mouth daily.     Calcium-Vitamin D-Vitamin K 500-100-40 MG-UNT-MCG CHEW Chew 2 tablets by mouth daily in the afternoon.     Cranberry 1000 MG CAPS Take 1,000 mg by mouth daily.     diltiazem (CARDIZEM CD) 240 MG 24  hr capsule Take 1 capsule (240 mg total) by mouth daily. 90 capsule 3   diphenhydrAMINE-Phenylephrine 6.25-2.5 MG/5ML LIQD Take by mouth as needed.      furosemide (LASIX) 20 MG tablet Take 1 tablet (20 mg total) by mouth daily. 90 tablet 3   lansoprazole (PREVACID) 30 MG capsule Take 30 mg by mouth 2 (two) times daily.     macitentan (OPSUMIT) 10 MG tablet TAKE 1 TABLET BY MOUTH DAILY. DO NOT HANDLE IF PREGNANT. DO NOT SPLIT,CRUSH, OR CHEW. REVIEW MEDICATION GUIDE. 30 tablet 11   potassium chloride (KLOR-CON M10) 10 MEQ tablet Take 1 tablet (10 mEq total) by mouth daily. 90 tablet 3   rosuvastatin (CRESTOR) 10 MG tablet Take 1 tablet (10 mg total) by mouth daily. 90 tablet 3   spironolactone (ALDACTONE) 25 MG tablet TAKE 1 TABLET EVERY DAY 90 tablet 0   tadalafil, PAH, (ADCIRCA) 20 MG tablet Take 20 mg by mouth 2 (two) times daily.     vitamin E 180 MG (400 UNITS) capsule Take 400 Units by mouth daily.       No current facility-administered medications for this encounter.    PHYSICAL EXAM: Vitals:   01/16/22 1026  BP: (!) 144/60  Pulse: 75  SpO2: 100%  Weight: 68.6 kg (151 lb 3.2 oz)    Wt Readings from Last 3 Encounters:  01/16/22 68.6 kg (151 lb 3.2 oz)  07/13/21 67.4 kg (148 lb 9.6 oz)  11/25/20 69 kg (152 lb 3.2 oz)   General:  Well appearing. No resp difficulty on 2L O2 HEENT: normal telengectasias Neck: supple. no JVD. Carotids 2+ bilat; no bruits. No lymphadenopathy or thryomegaly appreciated. Cor: PMI nondisplaced. Regular rate & rhythm. 2/6 TR Lungs: clear Abdomen: soft, nontender, nondistended. No hepatosplenomegaly. No bruits or masses. Good bowel sounds. Extremities: no cyanosis, clubbing, rash, edema Neuro: alert & orientedx3, cranial nerves grossly intact. moves all 4 extremities w/o difficulty. Affect pleasant intact. moves all 4 extremities w/o difficulty. 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 RVSP IVC small  - Echo today 01/17/12: EF 60-65% RV function normal RVSP IVC small Personally reviewed - Doing well. NYHA II though remains sedentary. - Reveal Lite score 5 (low risk)  - Continue macitentain 10 mg daily. - Continue tadalafil 40 mg daily. - Now intermediate risk. BNP 68 - Encouraged to be more active - today - will plan RHC to  reassess and see if she is a candidate for selexipag  2. CREST syndrome  - stable. - no swallowing issues, continue PPI.  3. HTN - Blood pressure realtively well controlled. Continue current regimen. - Continue spiro 25 mg daily - Continue diltiazem 240 mg daily.  4. Diastolic HF - stable NYHA II, volume status ok - Continue lasix 20 mg daily + 10 mEq KCl daily.  5. Chronic hypoxic respiratory failure - Followed by Dr. Delton Coombes. - Continue Home O2. - hi-RES CT 7/21: no ILD, fluid filled/dilated esophagus consistent with CREST, air-trapping, enlarged trunk indicative of pulm arterial htn - Has not followed back with Pulmonary  6. HL  - Continue statin - check lipids today   Arvilla Meres, MD  10:37 AM

## 2022-01-16 ENCOUNTER — Ambulatory Visit (HOSPITAL_BASED_OUTPATIENT_CLINIC_OR_DEPARTMENT_OTHER)
Admission: RE | Admit: 2022-01-16 | Discharge: 2022-01-16 | Disposition: A | Discharge status: Home / Self Care | Payer: Medicare PPO | Source: Ambulatory Visit | Attending: Internal Medicine | Admitting: Internal Medicine

## 2022-01-16 ENCOUNTER — Encounter (HOSPITAL_COMMUNITY): Payer: Self-pay | Admitting: Internal Medicine

## 2022-01-16 ENCOUNTER — Other Ambulatory Visit (HOSPITAL_COMMUNITY): Payer: Self-pay | Admitting: *Deleted

## 2022-01-16 ENCOUNTER — Ambulatory Visit (HOSPITAL_COMMUNITY)
Admission: RE | Admit: 2022-01-16 | Discharge: 2022-01-16 | Disposition: A | Discharge status: Home / Self Care | Payer: Medicare PPO | Source: Ambulatory Visit | Attending: Family Medicine | Admitting: Family Medicine

## 2022-01-16 VITALS — BP 144/60 | HR 75 | Wt 151.2 lb

## 2022-01-16 DIAGNOSIS — K219 Gastro-esophageal reflux disease without esophagitis: Secondary | ICD-10-CM | POA: Diagnosis not present

## 2022-01-16 DIAGNOSIS — J9611 Chronic respiratory failure with hypoxia: Secondary | ICD-10-CM | POA: Diagnosis not present

## 2022-01-16 DIAGNOSIS — I272 Pulmonary hypertension, unspecified: Secondary | ICD-10-CM

## 2022-01-16 DIAGNOSIS — E782 Mixed hyperlipidemia: Secondary | ICD-10-CM | POA: Diagnosis not present

## 2022-01-16 DIAGNOSIS — I5030 Unspecified diastolic (congestive) heart failure: Secondary | ICD-10-CM | POA: Diagnosis not present

## 2022-01-16 DIAGNOSIS — M341 CR(E)ST syndrome: Secondary | ICD-10-CM | POA: Insufficient documentation

## 2022-01-16 DIAGNOSIS — I2721 Secondary pulmonary arterial hypertension: Secondary | ICD-10-CM | POA: Diagnosis present

## 2022-01-16 DIAGNOSIS — I10 Essential (primary) hypertension: Secondary | ICD-10-CM

## 2022-01-16 DIAGNOSIS — Z79899 Other long term (current) drug therapy: Secondary | ICD-10-CM | POA: Insufficient documentation

## 2022-01-16 DIAGNOSIS — E785 Hyperlipidemia, unspecified: Secondary | ICD-10-CM | POA: Diagnosis not present

## 2022-01-16 DIAGNOSIS — I110 Hypertensive heart disease with heart failure: Secondary | ICD-10-CM | POA: Insufficient documentation

## 2022-01-16 DIAGNOSIS — R0600 Dyspnea, unspecified: Secondary | ICD-10-CM

## 2022-01-16 LAB — ECHOCARDIOGRAM COMPLETE
AR max vel: 1.69 cm2
AV Area VTI: 1.66 cm2
AV Area mean vel: 1.62 cm2
AV Mean grad: 10 mmHg
AV Peak grad: 16.8 mmHg
Ao pk vel: 2.05 m/s
Area-P 1/2: 4.8 cm2
S' Lateral: 2.6 cm

## 2022-01-16 LAB — COMPREHENSIVE METABOLIC PANEL
ALT: 8 U/L (ref 0–44)
AST: 21 U/L (ref 15–41)
Albumin: 4.1 g/dL (ref 3.5–5.0)
Alkaline Phosphatase: 58 U/L (ref 38–126)
Anion gap: 13 (ref 5–15)
BUN: 11 mg/dL (ref 8–23)
CO2: 20 mmol/L — ABNORMAL LOW (ref 22–32)
Calcium: 9.9 mg/dL (ref 8.9–10.3)
Chloride: 106 mmol/L (ref 98–111)
Creatinine, Ser: 0.74 mg/dL (ref 0.44–1.00)
GFR, Estimated: >60 mL/min (ref >60)
Glucose, Bld: 115 mg/dL — ABNORMAL HIGH (ref 70–99)
Potassium: 3.7 mmol/L (ref 3.5–5.1)
Sodium: 139 mmol/L (ref 135–145)
Total Bilirubin: 0.6 mg/dL (ref 0.3–1.2)
Total Protein: 6.5 g/dL (ref 6.5–8.1)

## 2022-01-16 LAB — CBC
HCT: 40.3 % (ref 36.0–46.0)
Hemoglobin: 12.5 g/dL (ref 12.0–15.0)
MCH: 28.3 pg (ref 26.0–34.0)
MCHC: 31 g/dL (ref 30.0–36.0)
MCV: 91.4 fL (ref 80.0–100.0)
Platelets: 273 10*3/uL (ref 150–400)
RBC: 4.41 MIL/uL (ref 3.87–5.11)
RDW: 16.8 % — ABNORMAL HIGH (ref 11.5–15.5)
WBC: 10.9 10*3/uL — ABNORMAL HIGH (ref 4.0–10.5)
nRBC: 0 % (ref 0.0–0.2)

## 2022-01-16 LAB — LIPID PANEL
Cholesterol: 177 mg/dL (ref 0–200)
HDL: 54 mg/dL (ref >40)
LDL Cholesterol: 103 mg/dL — ABNORMAL HIGH (ref 0–99)
Total CHOL/HDL Ratio: 3.3 RATIO
Triglycerides: 99 mg/dL (ref <150)
VLDL: 20 mg/dL (ref 0–40)

## 2022-01-16 LAB — BRAIN NATRIURETIC PEPTIDE: B Natriuretic Peptide: 335 pg/mL — ABNORMAL HIGH (ref 0.0–100.0)

## 2022-01-16 LAB — TSH: TSH: 1.28 u[IU]/mL (ref 0.350–4.500)

## 2022-01-16 NOTE — Patient Instructions (Signed)
Continue current medications  Labs done today, your results will be available in MyChart, we will contact you for abnormal readings.  Heart Cath on Friday 8/18, see instructions below  Your physician recommends that you schedule a follow-up appointment in: 6 months, **PLEASE CALL OUR OFFICE IN DECEMBER TO SCHEDULE THIS APPOINTMENT  If you have any questions or concerns before your next appointment please send [place] a message through [place] or call our office at [phone].    TO LEAVE A MESSAGE FOR THE NURSE SELECT OPTION 2, PLEASE LEAVE A MESSAGE INCLUDING: YOUR NAME DATE OF BIRTH CALL BACK NUMBER REASON FOR CALL**this is important as we prioritize the call backs  YOU WILL RECEIVE A CALL BACK THE SAME DAY AS LONG AS YOU CALL BEFORE 4:00 PM    CATHETERIZATION INSTRUCTIONS:  You are scheduled for a Cardiac Catheterization on Friday, August 18 with Dr. Arvilla Meres.  1. Please arrive at the Main Entrance A at [facility]: [address] [place], [place] [address] at 5:30 AM (This time is two hours before your procedure to ensure your preparation). Free valet parking service is available.   Special note: Every effort is made to have your procedure done on time. Please understand that emergencies sometimes delay scheduled procedures.  2. Diet: Do not eat solid foods after midnight.  You may have clear liquids until 5 AM upon the day of the procedure.  3. Labs: DONE TODAY  4. Medication instructions in preparation for your procedure:   FRIDAY 8/18 AM DO NOT TAKE FUROSEMIDE, SPIRONOLACTONE  On the morning of your procedure, take Aspirin and any morning medicines NOT listed above.  You may use sips of water.  5. Plan to go home the same day, you will only stay overnight if medically necessary. 6. You MUST have a responsible adult to drive you home. 7. An adult MUST be with you the first 24 hours after you arrive home. 8. Bring a current list of your medications, and the  last time and date medication taken. 9. Bring ID and current insurance cards. 10.Please wear clothes that are easy to get on and off and wear slip-on shoes.  Thank you for allowing [place] to care for you!   -- [facility]

## 2022-01-16 NOTE — Addendum Note (Signed)
Encounter addended by: Noralee Space, RN on: 01/16/2022 11:04 AM  Actions taken: Visit diagnoses modified, Order list changed, Diagnosis association updated, Clinical Note Signed, Charge Capture section accepted

## 2022-01-16 NOTE — Addendum Note (Signed)
Encounter addended by: Suezanne Cheshire, RN on: 01/16/2022 11:11 AM  Actions taken: Clinical Note Signed

## 2022-01-16 NOTE — Progress Notes (Signed)
6 Min Walk Test Completed  Pt ambulated 472.4 meters O2 Sat ranged 90%-98% on 2L oxygen HR ranged 79-124

## 2022-01-16 NOTE — Progress Notes (Signed)
  Echocardiogram 2D Echocardiogram has been performed.  Roosvelt Maser F 01/16/2022, 10:10 AM

## 2022-01-17 ENCOUNTER — Telehealth (HOSPITAL_COMMUNITY): Payer: Self-pay | Admitting: *Deleted

## 2022-01-20 ENCOUNTER — Ambulatory Visit (HOSPITAL_COMMUNITY)
Admission: RE | Admit: 2022-01-20 | Discharge: 2022-01-20 | Disposition: A | Discharge status: Home / Self Care | Payer: Medicare PPO | Attending: Internal Medicine | Admitting: Internal Medicine

## 2022-01-20 ENCOUNTER — Encounter (HOSPITAL_COMMUNITY): Payer: Self-pay | Admitting: Internal Medicine

## 2022-01-20 ENCOUNTER — Encounter (HOSPITAL_COMMUNITY)
Admission: RE | Disposition: A | Discharge status: Home / Self Care | Payer: Self-pay | Source: Home / Self Care | Attending: Internal Medicine

## 2022-01-20 ENCOUNTER — Other Ambulatory Visit: Payer: Self-pay

## 2022-01-20 DIAGNOSIS — I2721 Secondary pulmonary arterial hypertension: Secondary | ICD-10-CM | POA: Diagnosis present

## 2022-01-20 DIAGNOSIS — I272 Pulmonary hypertension, unspecified: Secondary | ICD-10-CM

## 2022-01-20 HISTORY — PX: RIGHT HEART CATH: CATH118263

## 2022-01-20 LAB — POCT I-STAT EG7
Acid-Base Excess: 1 mmol/L (ref 0.0–2.0)
Acid-Base Excess: 1 mmol/L (ref 0.0–2.0)
Acid-Base Excess: 3 mmol/L — ABNORMAL HIGH (ref 0.0–2.0)
Bicarbonate: 25.3 mmol/L (ref 20.0–28.0)
Bicarbonate: 26.1 mmol/L (ref 20.0–28.0)
Bicarbonate: 27.9 mmol/L (ref 20.0–28.0)
Calcium, Ion: 1.1 mmol/L — ABNORMAL LOW (ref 1.15–1.40)
Calcium, Ion: 1.19 mmol/L (ref 1.15–1.40)
Calcium, Ion: 1.33 mmol/L (ref 1.15–1.40)
HCT: 31 % — ABNORMAL LOW (ref 36.0–46.0)
HCT: 33 % — ABNORMAL LOW (ref 36.0–46.0)
HCT: 34 % — ABNORMAL LOW (ref 36.0–46.0)
Hemoglobin: 10.5 g/dL — ABNORMAL LOW (ref 12.0–15.0)
Hemoglobin: 11.2 g/dL — ABNORMAL LOW (ref 12.0–15.0)
Hemoglobin: 11.6 g/dL — ABNORMAL LOW (ref 12.0–15.0)
O2 Saturation: 80 %
O2 Saturation: 82 %
O2 Saturation: 82 %
Potassium: 3.1 mmol/L — ABNORMAL LOW (ref 3.5–5.1)
Potassium: 3.3 mmol/L — ABNORMAL LOW (ref 3.5–5.1)
Potassium: 3.6 mmol/L (ref 3.5–5.1)
Sodium: 139 mmol/L (ref 135–145)
Sodium: 142 mmol/L (ref 135–145)
Sodium: 143 mmol/L (ref 135–145)
TCO2: 26 mmol/L (ref 22–32)
TCO2: 27 mmol/L (ref 22–32)
TCO2: 29 mmol/L (ref 22–32)
pCO2, Ven: 39.9 mmHg — ABNORMAL LOW (ref 44–60)
pCO2, Ven: 40.2 mmHg — ABNORMAL LOW (ref 44–60)
pCO2, Ven: 42.7 mmHg — ABNORMAL LOW (ref 44–60)
pH, Ven: 7.41 (ref 7.25–7.43)
pH, Ven: 7.42 (ref 7.25–7.43)
pH, Ven: 7.423 (ref 7.25–7.43)
pO2, Ven: 44 mmHg (ref 32–45)
pO2, Ven: 45 mmHg (ref 32–45)
pO2, Ven: 45 mmHg (ref 32–45)

## 2022-01-20 SURGERY — RIGHT HEART CATH
Anesthesia: LOCAL

## 2022-01-20 MED ORDER — SODIUM CHLORIDE 0.9% FLUSH: 3.0000 mL | INTRAVENOUS | Status: DC | PRN

## 2022-01-20 MED ORDER — HEPARIN (PORCINE) IN NACL 1000-0.9 UT/500ML-% IV SOLN
INTRAVENOUS | Status: AC
  Filled 2022-01-20: qty 500

## 2022-01-20 MED ORDER — LIDOCAINE HCL (PF) 1 % IJ SOLN
INTRAMUSCULAR | Status: DC | PRN
  Administered 2022-01-20: 2 mL

## 2022-01-20 MED ORDER — HEPARIN (PORCINE) IN NACL 1000-0.9 UT/500ML-% IV SOLN
INTRAVENOUS | Status: DC | PRN
  Administered 2022-01-20: 500 mL

## 2022-01-20 MED ORDER — SODIUM CHLORIDE 0.9 % IV SOLN: 250.0000 mL | INTRAVENOUS | Status: DC | PRN

## 2022-01-20 MED ORDER — SODIUM CHLORIDE 0.9% FLUSH: 3.0000 mL | Freq: Two times a day (BID) | INTRAVENOUS | Status: DC

## 2022-01-20 MED ORDER — LIDOCAINE HCL (PF) 1 % IJ SOLN
INTRAMUSCULAR | Status: AC
  Filled 2022-01-20: qty 30

## 2022-01-20 MED ORDER — SODIUM CHLORIDE 0.9 % IV SOLN: INTRAVENOUS | Status: DC

## 2022-01-20 SURGICAL SUPPLY — 8 items
CATH BALLN WEDGE 5F 110CM (CATHETERS) IMPLANT
PACK CARDIAC CATHETERIZATION (CUSTOM PROCEDURE TRAY) ×1 IMPLANT
PROTECTION STATION PRESSURIZED (MISCELLANEOUS) ×1
SHEATH GLIDE SLENDER 4/5FR (SHEATH) IMPLANT
STATION PROTECTION PRESSURIZED (MISCELLANEOUS) IMPLANT
TRANSDUCER W/STOPCOCK (MISCELLANEOUS) ×1 IMPLANT
TUBING ART PRESS 72  MALE/FEM (TUBING) ×1
TUBING ART PRESS 72 MALE/FEM (TUBING) IMPLANT

## 2022-01-20 NOTE — Discharge Instructions (Signed)
CALL DR BENSIMHON'S OFFICE IF ANY PROBLEMS, QUESTIONS, OR CONCERNS; CALL IF ANY REDNESS, DRAINAGE, FEVER, PAIN, SWELLING, OR BLEEDING AT SITE OF PROCEDURE RIGHT ARM

## 2022-01-20 NOTE — Interval H&P Note (Signed)
History and Physical Interval Note:  01/20/2022 7:35 AM  Olivia Washington  has presented today for surgery, with the diagnosis of hp.  The various methods of treatment have been discussed with the patient and family. After consideration of risks, benefits and other options for treatment, the patient has consented to  Procedure(s): RIGHT HEART CATH (N/A) as a surgical intervention.  The patient's history has been reviewed, patient examined, no change in status, stable for surgery.  I have reviewed the patient's chart and labs.  Questions were answered to the patient's satisfaction.     [name]

## 2022-01-29 ENCOUNTER — Other Ambulatory Visit (HOSPITAL_COMMUNITY): Payer: Self-pay | Admitting: Internal Medicine

## 2022-03-29 ENCOUNTER — Other Ambulatory Visit (HOSPITAL_COMMUNITY): Payer: Self-pay

## 2022-03-29 MED ORDER — OPSUMIT 10 MG PO TABS: ORAL_TABLET | ORAL | 11 refills | Status: DC

## 2022-04-24 ENCOUNTER — Other Ambulatory Visit (HOSPITAL_COMMUNITY): Payer: Self-pay

## 2022-04-24 MED ORDER — TADALAFIL (PAH) 20 MG PO TABS: ORAL_TABLET | ORAL | 11 refills | Status: DC

## 2022-05-16 ENCOUNTER — Telehealth (HOSPITAL_COMMUNITY): Payer: Self-pay | Admitting: Pharmacist

## 2022-05-16 NOTE — Telephone Encounter (Signed)
Patient Advocate Encounter   Received notification from [facility] that prior authorization for tadalafil and opsumit are required.   PA submitted on CoverMyMeds Keys [id number], [id number] Status is pending   Will continue to follow.   Karle Plumber, PharmD, BCPS, BCCP, CPP Heart Failure Clinic Pharmacist [phone]

## 2022-05-16 NOTE — Telephone Encounter (Signed)
Advanced Heart Failure Patient Advocate Encounter  Prior Authorizations for tadalafil and Opsumit have been approved.    Effective dates: 06/05/21 through 06/05/2023  Karle Plumber, PharmD, BCPS, BCCP, CPP Heart Failure Clinic Pharmacist [phone]

## 2022-11-21 ENCOUNTER — Ambulatory Visit (HOSPITAL_COMMUNITY)
Admission: RE | Admit: 2022-11-21 | Discharge: 2022-11-21 | Disposition: A | Discharge status: Home / Self Care | Payer: Medicare PPO | Source: Ambulatory Visit | Attending: Internal Medicine | Admitting: Internal Medicine

## 2022-11-21 ENCOUNTER — Encounter (HOSPITAL_COMMUNITY): Payer: Self-pay | Admitting: Internal Medicine

## 2022-11-21 VITALS — BP 150/60 | HR 64 | Wt 145.8 lb

## 2022-11-21 DIAGNOSIS — I110 Hypertensive heart disease with heart failure: Secondary | ICD-10-CM | POA: Diagnosis not present

## 2022-11-21 DIAGNOSIS — K219 Gastro-esophageal reflux disease without esophagitis: Secondary | ICD-10-CM | POA: Insufficient documentation

## 2022-11-21 DIAGNOSIS — J9611 Chronic respiratory failure with hypoxia: Secondary | ICD-10-CM | POA: Insufficient documentation

## 2022-11-21 DIAGNOSIS — I5032 Chronic diastolic (congestive) heart failure: Secondary | ICD-10-CM | POA: Diagnosis not present

## 2022-11-21 DIAGNOSIS — I272 Pulmonary hypertension, unspecified: Secondary | ICD-10-CM | POA: Insufficient documentation

## 2022-11-21 DIAGNOSIS — Z79899 Other long term (current) drug therapy: Secondary | ICD-10-CM | POA: Diagnosis not present

## 2022-11-21 DIAGNOSIS — M341 CR(E)ST syndrome: Secondary | ICD-10-CM | POA: Insufficient documentation

## 2022-11-21 DIAGNOSIS — E785 Hyperlipidemia, unspecified: Secondary | ICD-10-CM | POA: Diagnosis not present

## 2022-11-21 LAB — COMPREHENSIVE METABOLIC PANEL
ALT: 8 U/L (ref 0–44)
AST: 15 U/L (ref 15–41)
Albumin: 3.9 g/dL (ref 3.5–5.0)
Alkaline Phosphatase: 59 U/L (ref 38–126)
Anion gap: 15 (ref 5–15)
BUN: 10 mg/dL (ref 8–23)
CO2: 26 mmol/L (ref 22–32)
Calcium: 10.9 mg/dL — ABNORMAL HIGH (ref 8.9–10.3)
Chloride: 98 mmol/L (ref 98–111)
Creatinine, Ser: 0.8 mg/dL (ref 0.44–1.00)
GFR, Estimated: >60 mL/min (ref >60)
Glucose, Bld: 103 mg/dL — ABNORMAL HIGH (ref 70–99)
Potassium: 3.9 mmol/L (ref 3.5–5.1)
Sodium: 139 mmol/L (ref 135–145)
Total Bilirubin: 0.7 mg/dL (ref 0.3–1.2)
Total Protein: 6.1 g/dL — ABNORMAL LOW (ref 6.5–8.1)

## 2022-11-21 LAB — CBC
HCT: 36.3 % (ref 36.0–46.0)
Hemoglobin: 11.6 g/dL — ABNORMAL LOW (ref 12.0–15.0)
MCH: 28.9 pg (ref 26.0–34.0)
MCHC: 32 g/dL (ref 30.0–36.0)
MCV: 90.5 fL (ref 80.0–100.0)
Platelets: 239 10*3/uL (ref 150–400)
RBC: 4.01 MIL/uL (ref 3.87–5.11)
RDW: 16.7 % — ABNORMAL HIGH (ref 11.5–15.5)
WBC: 7.4 10*3/uL (ref 4.0–10.5)
nRBC: 0 % (ref 0.0–0.2)

## 2022-11-21 LAB — BRAIN NATRIURETIC PEPTIDE: B Natriuretic Peptide: 121 pg/mL — ABNORMAL HIGH (ref 0.0–100.0)

## 2022-11-21 NOTE — Progress Notes (Signed)
Patient ID: Olivia Washington, female   DOB: August 22, 1945, 77 y.o.   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 Non-smoker.  In 1/18 DLCO was down so we did hi-res CT. No ILD. Mosaic pattern c/w pHTN.  Today she returns for [facility] follow up. Feels good. Now living in [place]. Continues on 2L O2. Not overly active but can do ADLs fine. Denies CP or SOB. Main limitation is bunion on her foot which is getting better. No syncope or presyncope. Compliant with meds.   Echo 01/16/22: EF 60-65% RV function normal RVSP IVC small   Echo  11/25/20: EF 60-65% RV normal. RVSP IVC small  Echo 6/21 EF 65% RV normal. No evidence PAH. IVC small Echo 9/19 EF 60% RV normal Trivial TR RVSP ~88mmHG. IVC small.  Echo 4/18 EF 60% RV normal IVC normal RVSP Echo 12/29/15 EF 55-60% RV normal. Trivial TR RVSP 35-40 IVC normal    RHC 11/18 RA = 2 RV = 31/3 PA = 35/10 (22) PCW = 8 Fick cardiac output/index = 9.6/5.2 PVR = 1.5 Ao sat = 100% PA sat = 80%, 80% High SVC = 82%  PFTs 11/17/19 FVC 3.14 (101%) FEV 2.08 (89%) FEV1/FVC 66% DLCO 48%   PFTs 05/25/16  FVC 3.17 (99%) FEV 2.30 (95%) FEV1/FVC 72 TLCO 4.01 (77%) DLCO 9.65 (37%)  PH Work up:  TTE 05/13/12: LVEF 55-60%. Grade 1 diastolic dysfunction. Ventricular septum with diastolic flattening. LA mildly dilated. RV mildly dilated with systolic function mod reduced. RA mildly dilated. Atrial septum bowed right to left. PAPP 54 mmHg.  CT scan chest 12/3/130 showed no ILD, no acute or chronic PE but some dilation of the PA's. Left thyroid enlargement with rightward tracheal deviation (ENT eval pending)  VQ scan 05/27/12: normal, no PE    RHC 05/24/12  RA: 11/8/6  RV: 86/7  PA: 78/27 (47) PCWP: 8/8/5   Cardiac Output  Thermodilution: 3.6 with index of 1.9  Fick : 5.9 with index of 3.1  11.6 woods units  At final adenosine dose  76/28 (46)  CO (thermo) = 4.3 with index of 2.3  9.5 Woods units  LHC: normal cors    06/22/12 1150 ft, O2 sat did drop to 82% on RA and pt was placed on 2L O2 via [place] and O2 sat improved to 92%.  11/14: 6 min walk test completed, pt ambulated 1110 ft (338 m), O2 sat ranged from 89-99 % on 2L, HR ranged from 84-113 1160 ft on O2 6L 09/23/13 1000 ft sats 88% 2L. (different person walking with her)  7/15  1350 feet 87-100%  4/16 1200 feet, 366 m 8/16 1340 feet, 408 m  7/17  1480 feet 459m on 2L O2 sat nadir at 88%  09/27/16 1240 ft (378 m) on 2L O2 sats ranged 85-96%, HR ranged 98-123. 11/22/16 1220 ft (323m) 10/08/17 1290 ft (359m) 05/14/20: on 2L O2 305 meters, O2 on 2L~90-92%, HR 91-137 bpm 11/25/20: on 2L 396.2 meters O2 Sat ranged 90%-98% 8/23 432m   ROS: All systems negative except as listed in HPI, PMH and Problem List.  Past Medical History:  Diagnosis Date   Allergy history unknown    CHF (congestive heart failure) (HCC)    CREST (calcinosis, Raynaud's phenomenon, esophageal dysfunction, sclerodactyly, telangiectasia) (HCC)    CREST syndrome (HCC)    GERD (gastroesophageal reflux disease)    Heart palpitations    Hyperlipidemia    Hypertension    Hypertension    Migraine    Osteoarthrosis    unspecified whether generalized or localized   Pulmonary hypertension (HCC)    SOB (shortness of breath)    Thyroid nodule     Current Outpatient Medications  Medication Sig Dispense Refill   aspirin 81 MG tablet Take 81 mg by mouth daily.     Calcium-Vitamin D-Vitamin K 500-100-40 MG-UNT-MCG CHEW Chew 2 tablets by mouth daily in the afternoon.     cetirizine (ZYRTEC) 10 MG tablet Take 10 mg by mouth daily.     Cranberry 1000 MG  CAPS Take 1,000 mg by mouth daily.     diltiazem (CARDIZEM CD) 240 MG 24 hr capsule Take 1 capsule (240 mg total) by mouth daily. 90 capsule 3   diphenhydrAMINE-Phenylephrine 6.25-2.5 MG/5ML LIQD Take 10 mLs by mouth daily as needed (Cough).     furosemide (LASIX) 20 MG tablet Take 1 tablet (20 mg total) by mouth daily. 90 tablet 3   lansoprazole (PREVACID) 30 MG capsule Take 30 mg by mouth 2 (two) times daily.     macitentan (OPSUMIT) 10 MG tablet TAKE 1 TABLET BY MOUTH DAILY. DO NOT HANDLE IF PREGNANT. DO NOT SPLIT,CRUSH, OR CHEW. REVIEW MEDICATION GUIDE. 30 tablet 11   potassium chloride (KLOR-CON M10) 10 MEQ tablet Take 1 tablet (10 mEq total) by mouth daily. 90 tablet 3   rosuvastatin (CRESTOR) 10 MG tablet Take 1 tablet (10 mg total) by mouth daily. 90 tablet 3   spironolactone (ALDACTONE) 25 MG tablet TAKE 1 TABLET EVERY DAY 90 tablet 3   Tadalafil, PAH, (ADCIRCA PO) Take 20 mg by mouth 2 (two) times daily.     vitamin E 180 MG (400 UNITS) capsule Take 400 Units by mouth daily.       No current facility-administered medications for this encounter.    PHYSICAL EXAM: Vitals:   11/21/22 1434  BP: (!) 150/60  Pulse: 64  SpO2: 99%  Weight: 66.1 kg (145 lb 12.8 oz)     Wt Readings from Last 3 Encounters:  11/21/22 66.1 kg (145 lb 12.8 oz)  01/20/22 68 kg (150 lb)  01/16/22 68.6 kg (151 lb 3.2 oz)   General:  Well appearing. No resp difficulty on 2L O2 HEENT: normal telengectasias Neck: supple. no JVD. Carotids 2+ bilat; no bruits. No lymphadenopathy or thryomegaly appreciated. Cor: PMI nondisplaced. Regular rate & rhythm. No rubs, gallops or murmurs. Lungs: clear Abdomen: soft, nontender, nondistended. No hepatosplenomegaly. No bruits or masses. Good bowel sounds. Extremities: no cyanosis, clubbing, rash, edema Neuro: alert & orientedx3, cranial nerves grossly intact. moves all 4 extremities w/o difficulty. Affect pleasant   ASSESSMENT & PLAN:  1. Pulmonary arterial HTN -  WHO Group I in setting of CTD - 11/25/20: on 2L 396.2 meters O2 Sat ranged 90%-98% - Echo 11/25/20: EF 60-65% RV normal. RVSP IVC small  - Echo today 01/17/12: EF 60-65% RV function normal RVSP IVC small Personally reviewed - Doing well. NYHA II though remains sedentary. - Reveal Lite score has been low risk - Continue macitentain 10 mg daily. - Continue tadalafil 40 mg daily. - Encouraged to be more active -  8/23 465m  - Stable repeat echo in 6 months - Labs today   2. CREST syndrome  - stable. - no swallowing issues, continue PPI.  3. HTN - Blood pressure realtively well controlled. Continue current regimen. - Continue spiro 25 mg daily - Continue diltiazem 240 mg daily.  4. Diastolic HF - stable NYHA II, volume status ok - Continue lasix 20 mg daily + 10 mEq KCl daily.  5. Chronic hypoxic respiratory failure - Followed by Dr. Delton Coombes. - Continue Home O2. - hi-RES CT 7/21: no ILD, fluid filled/dilated esophagus consistent with CREST, air-trapping, enlarged trunk indicative of pulm arterial htn - Has not followed back with Pulmonary  6. HL  - Continue statin - Las LDL 103 in 8/23  Arvilla Meres, MD  2:50 PM

## 2022-11-21 NOTE — Addendum Note (Signed)
Encounter addended by: Linda Hedges, RN on: 11/21/2022 3:04 PM  Actions taken: Order list changed, Diagnosis association updated, Clinical Note Signed, Charge Capture section accepted

## 2022-11-21 NOTE — Patient Instructions (Signed)
Medication Changes:  NO MEDICATION CHANGES TODAY   Lab Work:  Labs done today, your results will be available in MyChart, we will contact you for abnormal readings.   Testing/Procedures:  Your physician has requested that you have an echocardiogram IN 6 MONTHS. Echocardiography is a painless test that uses sound waves to create images of your heart. It provides your doctor with information about the size and shape of your heart and how well your heart's chambers and valves are working. This procedure takes approximately one hour. There are no restrictions for this procedure. Please do NOT wear cologne, perfume, aftershave, or lotions (deodorant is allowed). Please arrive 15 minutes prior to your appointment time.    Follow-Up in: IN 6 MONTHS AROUND DECEMBER 2024- PLEASE CALL THE OFFICE AROUND OCTOBER 2024 TO GET THIS SCHEDULED, THE NUMBER TO THE OFFICE IS [phone] OPTION 2   At the Advanced Heart Failure Clinic, you and your health needs are our priority. We have a designated team specialized in the treatment of Heart Failure. This Care Team includes your primary Heart Failure Specialized Cardiologist (physician), Advanced Practice Providers (APPs- Physician Assistants and Nurse Practitioners), and Pharmacist who all work together to provide you with the care you need, when you need it.   You may see any of the following providers on your designated Care Team at your next follow up:  Dr. Arvilla Meres Dr. Marca Ancona Dr. Marcos Eke, NP Robbie Lis, [place] [facility] [place], [place] Brynda Peon, NP Karle Plumber, PharmD   Please be sure to bring in all your medications bottles to every appointment.   Need to Contact us:  If you have any questions or concerns before your next appointment please send [place] a message through [place] or call our office at [phone].    TO LEAVE A MESSAGE FOR THE NURSE SELECT OPTION 2, PLEASE LEAVE A MESSAGE  INCLUDING: YOUR NAME DATE OF BIRTH CALL BACK NUMBER REASON FOR CALL**this is important as we prioritize the call backs  YOU WILL RECEIVE A CALL BACK THE SAME DAY AS LONG AS YOU CALL BEFORE 4:00 PM

## 2022-12-19 ENCOUNTER — Other Ambulatory Visit (HOSPITAL_COMMUNITY): Payer: Self-pay | Admitting: Internal Medicine

## 2022-12-24 ENCOUNTER — Other Ambulatory Visit (HOSPITAL_COMMUNITY): Payer: Self-pay | Admitting: Internal Medicine

## 2023-01-17 ENCOUNTER — Other Ambulatory Visit (HOSPITAL_COMMUNITY): Payer: Self-pay | Admitting: Internal Medicine

## 2023-01-31 ENCOUNTER — Other Ambulatory Visit (HOSPITAL_COMMUNITY): Payer: Self-pay | Admitting: Internal Medicine

## 2023-02-06 ENCOUNTER — Other Ambulatory Visit (HOSPITAL_COMMUNITY): Payer: Self-pay | Admitting: Internal Medicine

## 2023-02-17 ENCOUNTER — Other Ambulatory Visit (HOSPITAL_COMMUNITY): Payer: Self-pay | Admitting: Internal Medicine

## 2023-03-05 ENCOUNTER — Other Ambulatory Visit (HOSPITAL_COMMUNITY): Payer: Self-pay

## 2023-03-06 ENCOUNTER — Telehealth (HOSPITAL_COMMUNITY): Payer: Self-pay | Admitting: *Deleted

## 2023-03-06 NOTE — Telephone Encounter (Signed)
Approved Authorization #[id number]  Echo

## 2023-03-07 ENCOUNTER — Other Ambulatory Visit (HOSPITAL_COMMUNITY): Payer: Self-pay

## 2023-03-07 MED ORDER — TADALAFIL (PAH) 20 MG PO TABS: 20.0000 mg | ORAL_TABLET | Freq: Two times a day (BID) | ORAL | 1 refills | Status: DC

## 2023-03-07 NOTE — Addendum Note (Signed)
Addended by: Orlene Och on: 03/07/2023 09:04 AM   Modules accepted: Orders

## 2023-03-07 NOTE — Addendum Note (Signed)
Addended by: Orlene Och on: 03/07/2023 08:50 AM   Modules accepted: Orders

## 2023-03-21 ENCOUNTER — Telehealth (HOSPITAL_COMMUNITY): Payer: Self-pay | Admitting: Cardiology

## 2023-03-21 DIAGNOSIS — I272 Pulmonary hypertension, unspecified: Secondary | ICD-10-CM

## 2023-03-21 NOTE — Telephone Encounter (Signed)
Multiple message left on triage line from [facility] with a request to update 5 year renewal   Home oxygen home fill unit  Reports a RX is needed   Returned call [phone] Requested pre populated order to be sent over for signature' as this is a renewal.  Order to be sent to fax

## 2023-03-22 NOTE — Telephone Encounter (Signed)
Pt aware this has been done

## 2023-04-10 ENCOUNTER — Ambulatory Visit (HOSPITAL_BASED_OUTPATIENT_CLINIC_OR_DEPARTMENT_OTHER)
Admission: RE | Admit: 2023-04-10 | Discharge: 2023-04-10 | Disposition: A | Discharge status: Home / Self Care | Payer: Medicare PPO | Source: Ambulatory Visit | Attending: *Deleted | Admitting: *Deleted

## 2023-04-10 ENCOUNTER — Ambulatory Visit (HOSPITAL_COMMUNITY)
Admission: RE | Admit: 2023-04-10 | Discharge: 2023-04-10 | Disposition: A | Discharge status: Home / Self Care | Payer: Medicare PPO | Source: Ambulatory Visit | Attending: Internal Medicine | Admitting: Internal Medicine

## 2023-04-10 ENCOUNTER — Encounter (HOSPITAL_COMMUNITY): Payer: Self-pay | Admitting: Internal Medicine

## 2023-04-10 VITALS — BP 130/60 | HR 68 | Wt 146.6 lb

## 2023-04-10 DIAGNOSIS — J9611 Chronic respiratory failure with hypoxia: Secondary | ICD-10-CM | POA: Insufficient documentation

## 2023-04-10 DIAGNOSIS — I10 Essential (primary) hypertension: Secondary | ICD-10-CM | POA: Diagnosis not present

## 2023-04-10 DIAGNOSIS — E785 Hyperlipidemia, unspecified: Secondary | ICD-10-CM | POA: Insufficient documentation

## 2023-04-10 DIAGNOSIS — I351 Nonrheumatic aortic (valve) insufficiency: Secondary | ICD-10-CM | POA: Diagnosis not present

## 2023-04-10 DIAGNOSIS — I272 Pulmonary hypertension, unspecified: Secondary | ICD-10-CM | POA: Diagnosis not present

## 2023-04-10 DIAGNOSIS — M1990 Unspecified osteoarthritis, unspecified site: Secondary | ICD-10-CM | POA: Insufficient documentation

## 2023-04-10 DIAGNOSIS — I340 Nonrheumatic mitral (valve) insufficiency: Secondary | ICD-10-CM | POA: Diagnosis not present

## 2023-04-10 DIAGNOSIS — M341 CR(E)ST syndrome: Secondary | ICD-10-CM | POA: Diagnosis not present

## 2023-04-10 DIAGNOSIS — I2721 Secondary pulmonary arterial hypertension: Secondary | ICD-10-CM | POA: Insufficient documentation

## 2023-04-10 DIAGNOSIS — I5032 Chronic diastolic (congestive) heart failure: Secondary | ICD-10-CM | POA: Diagnosis not present

## 2023-04-10 DIAGNOSIS — I361 Nonrheumatic tricuspid (valve) insufficiency: Secondary | ICD-10-CM | POA: Insufficient documentation

## 2023-04-10 DIAGNOSIS — Z79899 Other long term (current) drug therapy: Secondary | ICD-10-CM | POA: Insufficient documentation

## 2023-04-10 DIAGNOSIS — I110 Hypertensive heart disease with heart failure: Secondary | ICD-10-CM | POA: Diagnosis not present

## 2023-04-10 DIAGNOSIS — K219 Gastro-esophageal reflux disease without esophagitis: Secondary | ICD-10-CM | POA: Insufficient documentation

## 2023-04-10 LAB — ECHOCARDIOGRAM COMPLETE
AV Vena cont: 0.2 cm
Area-P 1/2: 3.08 cm2
Calc EF: 65 %
P 1/2 time: 411 ms
S' Lateral: 2.7 cm
Single Plane A2C EF: 64.3 %
Single Plane A4C EF: 66.6 %

## 2023-04-10 NOTE — Progress Notes (Signed)
6 Min Walk Test Completed  Pt ambulated 153ft (457.7m) O2 Sat ranged 86-99 on 2L oxygen HR ranged 77-120

## 2023-04-10 NOTE — Addendum Note (Signed)
Encounter addended by: Suezanne Cheshire, RN on: 04/10/2023 12:43 PM  Actions taken: Clinical Note Signed

## 2023-04-10 NOTE — Patient Instructions (Signed)
Great to see you today!!!  You have been referred to re-establish care with [facility], they will call you for an appointment  Your physician recommends that you schedule a follow-up appointment in: 6 months (May 2025), **PLEASE CALL OUR OFFICE IN MARCH TO SCHEDULE THIS APPOINTMENT  If you have any questions or concerns before your next appointment please send [place] a message through [place] or call our office at [phone].    TO LEAVE A MESSAGE FOR THE NURSE SELECT OPTION 2, PLEASE LEAVE A MESSAGE INCLUDING: YOUR NAME DATE OF BIRTH CALL BACK NUMBER REASON FOR CALL**this is important as we prioritize the call backs  YOU WILL RECEIVE A CALL BACK THE SAME DAY AS LONG AS YOU CALL BEFORE 4:00 PM  At the Advanced Heart Failure Clinic, you and your health needs are our priority. As part of our continuing mission to provide you with exceptional heart care, we have created designated Provider Care Teams. These Care Teams include your primary Cardiologist (physician) and Advanced Practice Providers (APPs- Physician Assistants and Nurse Practitioners) who all work together to provide you with the care you need, when you need it.   You may see any of the following providers on your designated Care Team at your next follow up: Dr Arvilla Meres Dr Marca Ancona Dr. Dorthula Nettles Dr. Clearnce Hasten Amy Filbert Schilder, NP Robbie Lis, [place] [facility] [place], [place] Brynda Peon, NP [place], NP Karle Plumber, PharmD   Please be sure to bring in all your medications bottles to every appointment.    Thank you for choosing [facility]

## 2023-04-10 NOTE — Progress Notes (Signed)
SATURATION QUALIFICATIONS: (This note is used to comply with regulatory documentation for home oxygen)  Patient Saturations on Room Air at Rest = 95%  Patient Saturations on Room Air while Ambulating = 86%  Patient Saturations on 2 Liters of oxygen while Ambulating = 99%  Please briefly explain why patient needs home oxygen:Pulmonary hypertension and hypoxia

## 2023-04-10 NOTE — Addendum Note (Signed)
Encounter addended by: Noralee Space, RN on: 04/10/2023 12:32 PM  Actions taken: Order list changed, Diagnosis association updated, Clinical Note Signed

## 2023-04-10 NOTE — Progress Notes (Signed)
Patient ID: [place], female   DOB: Oct 05, 1945, 77 y.o.   MRN: [id number]  Advanced Heart Failure Clinic Note  Primary Care: Dr. Anda Latina Rheumatologist: Dr. Dierdre Forth ENT: Dr. Annalee Genta  HF Cardiologist: Dr. 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 Non-smoker.  In 1/18 DLCO was down so we did hi-res CT. No ILD. Mosaic pattern c/w pHTN.  On macitentan 10 and tadalafil 40.   Today she returns for [facility] follow up. Feels good. Now living in [place]. Continues on 2L O2. Not overly active but able to do all ADLs. No dizziness. Still with dry cough.No problems with meds.   Echo today 04/10/23 EF 65% Moderate LAE RV normal. No significant TR ot estimate RVSP IVC small Personally reviewed  Cardiac studies:  Echo 01/16/22: EF 60-65% RV function normal RVSP IVC small   Echo  11/25/20: EF 60-65% RV normal. RVSP IVC small  Echo 6/21 EF 65% RV normal. No evidence PAH. IVC small Echo 9/19 EF 60% RV normal Trivial TR RVSP ~74mmHG. IVC small.  Echo 4/18 EF 60% RV normal IVC normal RVSP Echo 12/29/15 EF 55-60% RV normal. Trivial TR RVSP 35-40 IVC normal    RHC 11/18 RA = 2 RV = 31/3 PA = 35/10 (22) PCW = 8 Fick cardiac output/index = 9.6/5.2 PVR = 1.5 Ao sat = 100% PA sat = 80%, 80% High SVC = 82%  PFTs 11/17/19 FVC 3.14 (101%) FEV 2.08 (89%) FEV1/FVC 66% DLCO 48%   PFTs 05/25/16  FVC 3.17 (99%) FEV 2.30 (95%) FEV1/FVC 72 TLCO 4.01 (77%) DLCO 9.65 (37%)  PH Work up:  TTE 05/13/12: LVEF 55-60%. Grade 1 diastolic dysfunction. Ventricular septum with diastolic flattening. LA mildly dilated. RV mildly dilated with systolic function mod reduced. RA mildly dilated. Atrial septum bowed right to left. PAPP 54 mmHg.  CT scan chest 12/3/130 showed no ILD, no acute or chronic PE but some dilation of the PA's. Left  thyroid enlargement with rightward tracheal deviation (ENT eval pending)  VQ scan 05/27/12: normal, no PE   RHC 05/24/12  RA: 11/8/6  RV: 86/7  PA: 78/27 (47) PCWP: 8/8/5   Cardiac Output  Thermodilution: 3.6 with index of 1.9  Fick : 5.9 with index of 3.1  11.6 woods units  At final adenosine dose  76/28 (46)  CO (thermo) = 4.3 with index of 2.3  9.5 Woods units  LHC: normal cors    06/22/12 1150 ft, O2 sat did drop to 82% on RA and pt was placed on 2L O2 via [address] and O2 sat improved to 92%.  11/14: 6 min walk test completed, pt ambulated 1110 ft (338 m), O2 sat ranged from 89-99 % on 2L, HR ranged from 84-113 1160 ft on O2 6L 09/23/13 1000 ft sats 88% 2L. (different person walking with her)  7/15  1350 feet 87-100%  4/16 1200 feet, 366 m 8/16 1340 feet, 408 m  7/17  1480 feet 471m on 2L O2 sat nadir at 88%  09/27/16 1240 ft (378 m) on 2L O2 sats ranged 85-96%, HR ranged 98-123. 11/22/16 1220 ft (367m) 10/08/17 1290 ft (326m) 05/14/20: on 2L O2 305 meters, O2 on 2L~90-92%, HR 91-137 bpm 11/25/20: on 2L 396.2 meters O2 Sat ranged 90%-98% 8/23 462m  ROS: All systems negative except as listed in HPI, PMH and Problem List.  Past Medical History:  Diagnosis Date   Allergy history unknown    CHF (congestive heart failure) (HCC)    CREST (calcinosis, Raynaud's phenomenon, esophageal dysfunction, sclerodactyly, telangiectasia) (HCC)    CREST syndrome (HCC)    GERD (gastroesophageal reflux disease)    Heart palpitations    Hyperlipidemia    Hypertension    Hypertension    Migraine    Osteoarthrosis    unspecified whether generalized or localized   Pulmonary hypertension (HCC)    SOB (shortness of breath)    Thyroid nodule     Current Outpatient Medications  Medication Sig Dispense Refill   aspirin 81 MG tablet Take 81 mg by mouth daily.     Calcium-Vitamin D-Vitamin K 500-100-40 MG-UNT-MCG CHEW Chew 2 tablets by mouth daily  in the afternoon.     cetirizine (ZYRTEC) 10 MG tablet Take 10 mg by mouth daily.     Cranberry 1000 MG CAPS Take 1,000 mg by mouth daily.     diltiazem (CARDIZEM CD) 240 MG 24 hr capsule TAKE 1 CAPSULE BY MOUTH EVERY DAY 90 capsule 3   diphenhydrAMINE-Phenylephrine 6.25-2.5 MG/5ML LIQD Take 10 mLs by mouth daily as needed (Cough).     furosemide (LASIX) 20 MG tablet TAKE 1 TABLET BY MOUTH EVERY DAY 90 tablet 3   KLOR-CON M10 10 MEQ tablet TAKE 1 TABLET BY MOUTH EVERY DAY 90 tablet 3   lansoprazole (PREVACID) 30 MG capsule Take 30 mg by mouth 2 (two) times daily.     OPSUMIT 10 MG tablet TAKE 1 TABLET BY MOUTH DAILY. DO NOT HANDLE IF PREGNANT. DO NOT SPLIT, CRUSH, OR CHEW. REVIEW MEDICATION GUIDE. 30 tablet 11   rosuvastatin (CRESTOR) 10 MG tablet TAKE 1 TABLET BY MOUTH EVERY DAY 90 tablet 3   spironolactone (ALDACTONE) 25 MG tablet TAKE 1 TABLET EVERY DAY 90 tablet 3   tadalafil, PAH, (ADCIRCA) 20 MG tablet Take 1 tablet (20 mg total) by mouth 2 (two) times daily. 180 tablet 1   vitamin E 180 MG (400 UNITS) capsule Take 400 Units by mouth daily.       No current facility-administered medications for this encounter.    PHYSICAL EXAM: Vitals:   04/10/23 1139  BP: 130/60  Pulse: 68  SpO2: 100%  Weight: 66.5 kg (146 lb 9.6 oz)     Wt Readings from Last 3 Encounters:  04/10/23 66.5 kg (146 lb 9.6 oz)  11/21/22 66.1 kg (145 lb 12.8 oz)  01/20/22 68 kg (150 lb)   General:  Well appearing. No resp difficulty on 2L O2 HEENT: normal telengectasias Neck: supple. no JVD. Carotids 2+ bilat; no bruits. No lymphadenopathy or thryomegaly appreciated. Cor: PMI nondisplaced. Regular rate & rhythm.2/6 TR Lungs: clear Abdomen: soft, nontender, nondistended. No hepatosplenomegaly. No bruits or masses. Good bowel sounds. Extremities: no cyanosis, clubbing, rash, edema  + arthritic deformities Neuro: alert & orientedx3, cranial nerves grossly intact. moves all 4 extremities w/o difficulty. 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 RVSP IVC small  - Echo 01/16/22: EF 60-65% RV function normal RVSP IVC small  - Echo today 04/10/23 EF 65% Moderate LAE RV normal. No significant TR ot estimate RVSP IVC small Personally reviewed - Doing well. NYHA  II remains sedentary - Reveal Lite score has been low risk - Continue macitentain 10 mg daily. - Continue tadalafil 40 mg daily. - Encouraged to be more active - 8/23 465m   2. CREST syndrome  - stable - no swallowing issues, continue PPI. - Encouraged her to f/u with Dr Dierdre Forth   3. HTN - Blood pressure well controlled. Continue current regimen. - Continue spiro 25 mg daily - Continue diltiazem 240 mg daily.  4. Diastolic HF - stable NYHA II, volume status ok - Continue lasix 20 mg daily + 10 mEq KCl daily.  5. Chronic hypoxic respiratory failure - Followed by Dr. Delton Coombes. - Continue home O2 - hi-RES CT 7/21: no ILD, fluid filled/dilated esophagus consistent with CREST, air-trapping, enlarged trunk indicative of pulm arterial htn - Has not followed back with Pulmonary  6. HL  - Continue statin  Arvilla Meres, MD  11:46 AM

## 2023-04-19 ENCOUNTER — Telehealth (HOSPITAL_COMMUNITY): Payer: Self-pay

## 2023-04-19 NOTE — Telephone Encounter (Signed)
Patient called in regarding [employer] health form for oxygen. The form is for medical necessity.   Form is at triage desk, just needs to be signed. Advised patient I would work on getting this signed and faxed in. Patient aware and verbalized understanding.

## 2023-05-10 ENCOUNTER — Telehealth (HOSPITAL_COMMUNITY): Payer: Self-pay | Admitting: Pharmacist

## 2023-05-10 NOTE — Telephone Encounter (Signed)
Patient Advocate Encounter   Received notification from [facility] that prior authorization for tadalafil is required.   PA submitted on CoverMyMeds Key [id number],  Status is pending   Will continue to follow.   Karle Plumber, PharmD, BCPS, BCCP, CPP Heart Failure Clinic Pharmacist [phone]

## 2023-05-10 NOTE — Telephone Encounter (Signed)
Advanced Heart Failure Patient Advocate Encounter  Prior Authorization for tadalafil has been approved.    PA# [id number] Effective dates: 06/05/22 through 06/04/2024  Additionally, attempted to submit Opsumit PA (key [id number]). Received the following message: Authorization already on file for this request. Authorization starting on 03/06/2023 and ending on 06/04/2024.  Karle Plumber, PharmD, BCPS, BCCP, CPP Heart Failure Clinic Pharmacist [phone]

## 2023-06-21 ENCOUNTER — Institutional Professional Consult (permissible substitution): Payer: Medicare PPO | Admitting: Emergency Medicine

## 2023-08-19 ENCOUNTER — Other Ambulatory Visit (HOSPITAL_COMMUNITY): Payer: Self-pay | Admitting: Internal Medicine

## 2023-08-28 ENCOUNTER — Institutional Professional Consult (permissible substitution): Payer: Medicare PPO | Admitting: Emergency Medicine

## 2023-08-30 ENCOUNTER — Encounter: Payer: Self-pay | Admitting: Emergency Medicine

## 2023-08-30 ENCOUNTER — Ambulatory Visit: Payer: Medicare PPO | Admitting: Emergency Medicine

## 2023-08-30 VITALS — BP 148/67 | HR 72 | Ht 65.0 in | Wt 145.2 lb

## 2023-08-30 DIAGNOSIS — K219 Gastro-esophageal reflux disease without esophagitis: Secondary | ICD-10-CM

## 2023-08-30 DIAGNOSIS — I272 Pulmonary hypertension, unspecified: Secondary | ICD-10-CM

## 2023-08-30 DIAGNOSIS — M341 CR(E)ST syndrome: Secondary | ICD-10-CM | POA: Diagnosis not present

## 2023-08-30 DIAGNOSIS — R053 Chronic cough: Secondary | ICD-10-CM | POA: Diagnosis not present

## 2023-08-30 MED ORDER — BENZONATATE 100 MG PO CAPS
100.0000 mg | ORAL_CAPSULE | Freq: Four times a day (QID) | ORAL | 1 refills | Status: DC | PRN
Start: 2023-08-30 — End: 2023-11-14

## 2023-08-30 MED ORDER — ALBUTEROL SULFATE HFA 108 (90 BASE) MCG/ACT IN AERS
2.0000 | INHALATION_SPRAY | Freq: Four times a day (QID) | RESPIRATORY_TRACT | 6 refills | Status: AC | PRN
Start: 2023-08-30 — End: ?

## 2023-08-30 NOTE — Assessment & Plan Note (Signed)
 Multifactorial.  Unfortunately GERD is probably a big contributor and is related to her crest syndrome.  I will try to treat maximally but she has persistent symptoms despite a Nissen, PPI twice daily . Please continue your Prevacid twice a day. Start Pepcid 20 mg twice a day until next visit Continue your cetirizine once daily Continue your fluticasone nasal spray, 2 sprays each nostril once daily.  If you continue to have nasal congestion and drainage you could consider increasing this to twice daily.  Try to avoid taking it right before bedtime because we do not want the medication to drain down to your throat. Consider starting nasal saline rinses once daily Continue your guaifenesin DM as needed for cough suppression Try starting Tessalon Perles up to every 6 hours if needed for cough suppression We will try albuterol inhaler, 2 puffs when needed for chest tightness, shortness of breath, spells of coughing. We may decide to consider repeating your pulmonary function testing at some point going forward. Follow Dr. Delton Coombes in 2 to 3 months

## 2023-08-30 NOTE — Patient Instructions (Signed)
 Please continue your Prevacid twice a day. Start Pepcid 20 mg twice a day until next visit Continue your cetirizine once daily Continue your fluticasone nasal spray, 2 sprays each nostril once daily.  If you continue to have nasal congestion and drainage you could consider increasing this to twice daily.  Try to avoid taking it right before bedtime because we do not want the medication to drain down to your throat. Consider starting nasal saline rinses once daily Continue your guaifenesin DM as needed for cough suppression Try starting Tessalon Perles up to every 6 hours if needed for cough suppression We will try albuterol inhaler, 2 puffs when needed for chest tightness, shortness of breath, spells of coughing. We may decide to consider repeating your pulmonary function testing at some point going forward. Continue your follow-up with cardiology, your Opsumit and tadalafil as prescribed Follow Dr. Delton Coombes in 2 to 3 months

## 2023-08-30 NOTE — Progress Notes (Signed)
 Subjective:    Patient ID: [place], female    DOB: 05-11-1946, 78 y.o.   MRN: [id number]  HPI 78 year old never smoker with a history of crest syndrome and associated PAH on tadalafil and Opsumit, systemic hypertension, GERD (lap Nissen).  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  COVID in 2023. She has GERD Prevacid, allergic rhinitis on cetirizine and fluticasone.  She has resting and exertional hypoxemia on 2L/min. No SOB, wheeze or chest tightness.  She is here today for chronic cough, began to happen several years ago without any clear inciting event. She has some clear to whitish mucous, no hemoptysis. Seems to happen most in the am when she gets up, clear mucous at that time. Cough can also happen in evening. She has breakthrough GERD on the Prevacid due to her CREST.   Pulmonary function testing 11/17/2019 reviewed by me showed mild obstruction with an FEV1 of 89% predicted.  No bronchodilator response, normal lung volumes, decreased diffusion capacity that does not correct to the normal range when adjusted for alveolar volume.   Review of Systems As per HPI  Past Medical History:  Diagnosis Date   Allergy history unknown    CHF (congestive heart failure) (HCC)    CREST (calcinosis, Raynaud's phenomenon, esophageal dysfunction, sclerodactyly, telangiectasia) (HCC)    CREST syndrome (HCC)    GERD (gastroesophageal reflux disease)    Heart palpitations    Hyperlipidemia    Hypertension    Hypertension    Migraine    Osteoarthrosis    unspecified whether generalized or localized   Pulmonary hypertension (HCC)    SOB (shortness of breath)    Thyroid nodule      Family History  Problem Relation Age of Onset   Stroke Mother    Hypertension Mother    Hyperlipidemia Mother    Diabetes Father    Heart failure Father    Hyperlipidemia Brother    Hypertension Brother    Cancer Father        70     Social History   Socioeconomic History    Marital status: Married    Spouse name: Not on file   Number of children: 2   Years of education: Not on file   Highest education level: Not on file  Occupational History   Occupation: Technical brewer: [employer]  Tobacco Use   Smoking status: Never   Smokeless tobacco: Never  Substance and Sexual Activity   Alcohol use: No   Drug use: No   Sexual activity: Not Currently  Other Topics Concern   Not on file  Social History Narrative   Not on file   Social Drivers of Health   Financial Resource Strain: Not on file  Food Insecurity: Not on file  Transportation Needs: Not on file  Physical Activity: Not on file  Stress: Not on file  Social Connections: Unknown (10/09/2022)   Received from [facility]   Social Network    Social Network: Not on file  Intimate Partner Violence: Unknown (10/09/2022)   Received from [employer], [facility]   HITS    Physically Hurt: Not on file    Insult or Talk Down To: Not on file    Threaten Physical Harm: Not on file    Scream or Curse: Not on file     Allergies  Allergen Reactions   Propofol Other (See Comments)    CANNOT HAVE DUE TO  PULMONARY HYPERTENSION !!!   Erythromycin Other (See Comments)    Interacts with Adcirca   Other Other (See Comments)    RAW ONIONS, SOME CHEESES CAUSE MIGRAINES   Allegra [Fexofenadine]     vomitting   Atorvastatin Other (See Comments)    Muscle aches   Chlorzoxazone Other (See Comments)    Altered Mental status   Cortisporin [Neomycin-Polymyxin-Hc]     Blister    Macrodantin Swelling   Pravastatin Other (See Comments)    Muscle aches    Simvastatin Other (See Comments)    Muscle aches    Zithromax [Azithromycin Dihydrate]     migraines   Cephalexin Itching and Rash   Neomycin Rash   Neomycin-Polymyxin-Hc Rash   Penicillins Rash   Septra [Bactrim] Itching and Rash   Sulfa Drugs Cross Reactors Rash   Tape Rash     Outpatient Medications Prior to  Visit  Medication Sig Dispense Refill   aspirin 81 MG tablet Take 81 mg by mouth daily.     Calcium-Vitamin D-Vitamin K 500-100-40 MG-UNT-MCG CHEW Chew 2 tablets by mouth daily in the afternoon.     cetirizine (ZYRTEC) 10 MG tablet Take 10 mg by mouth daily.     Cranberry 1000 MG CAPS Take 1,000 mg by mouth daily.     diltiazem (CARDIZEM CD) 240 MG 24 hr capsule TAKE 1 CAPSULE BY MOUTH EVERY DAY 90 capsule 3   diphenhydrAMINE-Phenylephrine 6.25-2.5 MG/5ML LIQD Take 10 mLs by mouth daily as needed (Cough).     furosemide (LASIX) 20 MG tablet TAKE 1 TABLET BY MOUTH EVERY DAY 90 tablet 3   KLOR-CON M10 10 MEQ tablet TAKE 1 TABLET BY MOUTH EVERY DAY 90 tablet 3   lansoprazole (PREVACID) 30 MG capsule Take 30 mg by mouth 2 (two) times daily.     OPSUMIT 10 MG tablet TAKE 1 TABLET BY MOUTH DAILY. DO NOT HANDLE IF PREGNANT. DO NOT SPLIT, CRUSH, OR CHEW. REVIEW MEDICATION GUIDE. 30 tablet 11   rosuvastatin (CRESTOR) 10 MG tablet TAKE 1 TABLET BY MOUTH EVERY DAY 90 tablet 3   spironolactone (ALDACTONE) 25 MG tablet TAKE 1 TABLET EVERY DAY 90 tablet 3   tadalafil, PAH, (ADCIRCA) 20 MG tablet TAKE 1 TABLET BY MOUTH TWICE A DAY 60 tablet 6   vitamin E 180 MG (400 UNITS) capsule Take 400 Units by mouth daily.       No facility-administered medications prior to visit.        Objective:   Physical Exam Vitals:   08/30/23 0908 08/30/23 0910  BP: (!) 145/70 (!) 148/67  Pulse: 72   SpO2: 97%   Weight: 145 lb 3.2 oz (65.9 kg)   Height: 5\' 5"  (1.651 m)    Gen: Pleasant, thin, in no distress,  normal affect  ENT: No lesions,  mouth clear,  oropharynx clear, no postnasal drip  Neck: No JVD, no stridor but hoarse voice  Lungs: No use of accessory muscles, no crackles or wheezing on normal respiration, no wheeze on forced expiration  Cardiovascular: RRR, heart sounds normal, no murmur or gallops, no peripheral edema  Musculoskeletal: No deformities, no cyanosis or clubbing  Neuro: alert, awake,  non focal  Skin: Warm, no lesions or rash      Assessment & Plan:  Chronic cough Multifactorial.  Unfortunately GERD is probably a big contributor and is related to her crest syndrome.  I will try to treat maximally but she has persistent symptoms despite a Nissen, PPI twice daily .  Please continue your Prevacid twice a day. Start Pepcid 20 mg twice a day until next visit Continue your cetirizine once daily Continue your fluticasone nasal spray, 2 sprays each nostril once daily.  If you continue to have nasal congestion and drainage you could consider increasing this to twice daily.  Try to avoid taking it right before bedtime because we do not want the medication to drain down to your throat. Consider starting nasal saline rinses once daily Continue your guaifenesin DM as needed for cough suppression Try starting Tessalon Perles up to every 6 hours if needed for cough suppression We will try albuterol inhaler, 2 puffs when needed for chest tightness, shortness of breath, spells of coughing. We may decide to consider repeating your pulmonary function testing at some point going forward. Follow Dr. Delton Coombes in 2 to 3 months  Pulmonary HTN [facility]) Continue your follow-up with cardiology, your Opsumit and tadalafil as prescribed   Levy Pupa, MD, PhD 08/30/2023, 4:47 PM  Pulmonary and Critical Care [phone] or if no answer before 7:00PM call [phone] For any issues after 7:00PM please call eLink [phone]

## 2023-08-30 NOTE — Assessment & Plan Note (Signed)
 Continue your follow-up with cardiology, your Opsumit and tadalafil as prescribed

## 2023-08-31 DIAGNOSIS — I2783 Eisenmenger's syndrome: Secondary | ICD-10-CM | POA: Diagnosis not present

## 2023-09-24 ENCOUNTER — Other Ambulatory Visit (HOSPITAL_COMMUNITY): Payer: Self-pay | Admitting: Internal Medicine

## 2023-10-01 DIAGNOSIS — I2783 Eisenmenger's syndrome: Secondary | ICD-10-CM | POA: Diagnosis not present

## 2023-10-08 ENCOUNTER — Encounter (HOSPITAL_COMMUNITY): Payer: Self-pay | Admitting: Internal Medicine

## 2023-10-08 ENCOUNTER — Ambulatory Visit (HOSPITAL_COMMUNITY)
Admission: RE | Admit: 2023-10-08 | Discharge: 2023-10-08 | Disposition: A | Discharge status: Home / Self Care | Source: Ambulatory Visit | Attending: Internal Medicine | Admitting: Internal Medicine

## 2023-10-08 VITALS — BP 130/58 | HR 68 | Wt 146.0 lb

## 2023-10-08 DIAGNOSIS — I10 Essential (primary) hypertension: Secondary | ICD-10-CM | POA: Diagnosis not present

## 2023-10-08 DIAGNOSIS — J9611 Chronic respiratory failure with hypoxia: Secondary | ICD-10-CM | POA: Insufficient documentation

## 2023-10-08 DIAGNOSIS — I5032 Chronic diastolic (congestive) heart failure: Secondary | ICD-10-CM

## 2023-10-08 DIAGNOSIS — I272 Pulmonary hypertension, unspecified: Secondary | ICD-10-CM

## 2023-10-08 DIAGNOSIS — M341 CR(E)ST syndrome: Secondary | ICD-10-CM | POA: Diagnosis not present

## 2023-10-08 DIAGNOSIS — I2721 Secondary pulmonary arterial hypertension: Secondary | ICD-10-CM | POA: Insufficient documentation

## 2023-10-08 DIAGNOSIS — E782 Mixed hyperlipidemia: Secondary | ICD-10-CM

## 2023-10-08 DIAGNOSIS — I110 Hypertensive heart disease with heart failure: Secondary | ICD-10-CM | POA: Insufficient documentation

## 2023-10-08 DIAGNOSIS — Z79899 Other long term (current) drug therapy: Secondary | ICD-10-CM | POA: Diagnosis not present

## 2023-10-08 DIAGNOSIS — E785 Hyperlipidemia, unspecified: Secondary | ICD-10-CM | POA: Insufficient documentation

## 2023-10-08 LAB — BRAIN NATRIURETIC PEPTIDE: B Natriuretic Peptide: 119.3 pg/mL — ABNORMAL HIGH (ref 0.0–100.0)

## 2023-10-08 LAB — CBC
HCT: 36.2 % (ref 36.0–46.0)
Hemoglobin: 11 g/dL — ABNORMAL LOW (ref 12.0–15.0)
MCH: 25.8 pg — ABNORMAL LOW (ref 26.0–34.0)
MCHC: 30.4 g/dL (ref 30.0–36.0)
MCV: 85 fL (ref 80.0–100.0)
Platelets: 244 10*3/uL (ref 150–400)
RBC: 4.26 MIL/uL (ref 3.87–5.11)
RDW: 17.4 % — ABNORMAL HIGH (ref 11.5–15.5)
WBC: 10 10*3/uL (ref 4.0–10.5)
nRBC: 0 % (ref 0.0–0.2)

## 2023-10-08 LAB — LIPID PANEL
Cholesterol: 179 mg/dL (ref 0–200)
HDL: 49 mg/dL (ref >40)
LDL Cholesterol: 103 mg/dL — ABNORMAL HIGH (ref 0–99)
Total CHOL/HDL Ratio: 3.7 ratio
Triglycerides: 137 mg/dL (ref <150)
VLDL: 27 mg/dL (ref 0–40)

## 2023-10-08 NOTE — Patient Instructions (Signed)
 Great to see you today!!!  Labs done today, your results will be available in MyChart, we will contact you for abnormal readings.  Your physician recommends that you schedule a follow-up appointment in: 6 months with an echocardiogram (November), **PLEASE CALL OUR OFFICE IN SEPTEMBER TO SCHEDULE THIS APPOINTMENT  If you have any questions or concerns before your next appointment please send us  a message through [place] or call our office at [phone].    TO LEAVE A MESSAGE FOR THE NURSE SELECT OPTION 2, PLEASE LEAVE A MESSAGE INCLUDING: YOUR NAME DATE OF BIRTH CALL BACK NUMBER REASON FOR CALL**this is important as we prioritize the call backs  YOU WILL RECEIVE A CALL BACK THE SAME DAY AS LONG AS YOU CALL BEFORE 4:00 PM  At the Advanced Heart Failure Clinic, you and your health needs are our priority. As part of our continuing mission to provide you with exceptional heart care, we have created designated Provider Care Teams. These Care Teams include your primary Cardiologist (physician) and Advanced Practice Providers (APPs- Physician Assistants and Nurse Practitioners) who all work together to provide you with the care you need, when you need it.   You may see any of the following providers on your designated Care Team at your next follow up: Dr Jules Oar Dr Peder Bourdon Dr. Alwin Baars Dr. Arta Lark Amy Marijane Shoulders, NP Ruddy Corral, [place] [facility] [place], [place] Dennise Fitz, NP [place] Lee, NP Shawnee Dellen, NP Luster Salters, PharmD Bevely Brush, PharmD   Please be sure to bring in all your medications bottles to every appointment.    Thank you for choosing [facility]

## 2023-10-08 NOTE — Progress Notes (Signed)
 Patient ID: Olivia Washington, female   DOB: 1946-05-27, 78 y.o.   MRN: [id number]  [facility] Note  Primary Care: Dr. Harlen Washington Rheumatologist: Dr. Ebbie Washington ENT: Dr. Melissa Washington  HF Cardiologist: Dr. Julane Washington  Chief complaint: PAH  HPI:   Ms. 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 Non-smoker.  In 1/18 DLCO was down so we did hi-res CT. No ILD. Mosaic pattern c/w pHTN.  On macitentan  10 and tadalafil  40.   Echo 04/10/23 EF 65% Moderate LAE RV normal. No significant TR ot estimate RVSP IVC small Personally reviewed  Today she returns for [facility] follow up. Feels good. Now living in [place]. Continues on 2L O2. Not very active. Working as a Air traffic controller. Denies CP or SOB. No problems with meds. No syncope or presyncope.   Cardiac studies:  Echo 01/16/22: EF 60-65% RV function normal RVSP IVC small   Echo  11/25/20: EF 60-65% RV normal. RVSP IVC small  Echo 6/21 EF 65% RV normal. No evidence PAH. IVC small Echo 9/19 EF 60% RV normal Trivial TR RVSP ~47mmHG. IVC small.  Echo 4/18 EF 60% RV normal IVC normal RVSP Echo 12/29/15 EF 55-60% RV normal. Trivial TR RVSP 35-40 IVC normal    RHC 11/18 RA = 2 RV = 31/3 PA = 35/10 (22) PCW = 8 Fick cardiac output/index = 9.6/5.2 PVR = 1.5 Ao sat = 100% PA sat = 80%, 80% High SVC = 82%  PFTs 11/17/19 FVC 3.14 (101%) FEV 2.08 (89%) FEV1/FVC 66% DLCO 48%   PFTs 05/25/16  FVC 3.17 (99%) FEV 2.30 (95%) FEV1/FVC 72 TLCO 4.01 (77%) DLCO 9.65 (37%)  PH Work up:  TTE 05/13/12: LVEF 55-60%. Grade 1 diastolic dysfunction. Ventricular septum with diastolic flattening. LA mildly dilated. RV mildly dilated with systolic function mod reduced. RA mildly dilated. Atrial septum bowed right to left. PAPP 54 mmHg.  CT scan chest 12/3/130 showed no ILD, no acute or chronic PE but some  dilation of the PA's. Left thyroid  enlargement with rightward tracheal deviation (ENT eval pending)  VQ scan 05/27/12: normal, no PE   RHC 05/24/12  RA: 11/8/6  RV: 86/7  PA: 78/27 (47) PCWP: 8/8/5   Cardiac Output  Thermodilution: 3.6 with index of 1.9  Fick : 5.9 with index of 3.1  11.6 woods units  At final adenosine  dose  76/28 (46)  CO (thermo) = 4.3 with index of 2.3  9.5 Woods units  LHC: normal cors    06/22/12 1150 ft, O2 sat did drop to 82% on RA and pt was placed on 2L O2 via [place] and O2 sat improved to 92%.  11/14: 6 min walk test completed, pt ambulated 1110 ft (338 m), O2 sat ranged from 89-99 % on 2L, HR ranged from 84-113 1160 ft on O2 6L 09/23/13 1000 ft sats 88% 2L. (different person walking with her)  7/15  1350 feet 87-100%  4/16 1200 feet, 366 m 8/16 1340 feet, 408 m  7/17  1480 feet 458m on 2L O2 sat nadir at 88%  09/27/16 1240 ft (378 m) on 2L O2 sats ranged 85-96%, HR ranged 98-123. 11/22/16 1220 ft (39m) 10/08/17 1290 ft (331m) 05/14/20: on 2L O2 305 meters, O2 on 2L~90-92%, HR 91-137 bpm 11/25/20: on 2L 396.2 meters O2 Sat ranged  90%-98% 8/23 428m  11/24 450m HR 77-120 sats 86-99 on 2L   ROS: All systems negative except as listed in HPI, PMH and Problem List.  Past Medical History:  Diagnosis Date   Allergy history unknown    CHF (congestive heart failure) (HCC)    CREST (calcinosis, Raynaud's phenomenon, esophageal dysfunction, sclerodactyly, telangiectasia) (HCC)    CREST syndrome (HCC)    GERD (gastroesophageal reflux disease)    Heart palpitations    Hyperlipidemia    Hypertension    Hypertension    Migraine    Osteoarthrosis    unspecified whether generalized or localized   Pulmonary hypertension (HCC)    SOB (shortness of breath)    Thyroid  nodule     Current Outpatient Medications  Medication Sig Dispense Refill   albuterol  (VENTOLIN  HFA) 108 (90 Base) MCG/ACT inhaler Inhale 2  puffs into the lungs every 6 (six) hours as needed for wheezing or shortness of breath. 8 g 6   aspirin  81 MG tablet Take 81 mg by mouth daily.     benzonatate  (TESSALON ) 100 MG capsule Take 1 capsule (100 mg total) by mouth every 6 (six) hours as needed for cough. 30 capsule 1   Calcium -Vitamin D-Vitamin K 500-100-40 MG-UNT-MCG CHEW Chew 2 tablets by mouth daily in the afternoon.     cetirizine (ZYRTEC) 10 MG tablet Take 10 mg by mouth daily.     Cranberry 1000 MG CAPS Take 1,000 mg by mouth daily.     diltiazem  (CARDIZEM  CD) 240 MG 24 hr capsule TAKE 1 CAPSULE BY MOUTH EVERY DAY 90 capsule 3   diphenhydrAMINE-Phenylephrine 6.25-2.5 MG/5ML LIQD Take 10 mLs by mouth daily as needed (Cough).     famotidine (PEPCID) 20 MG tablet Take 20 mg by mouth 2 (two) times daily.     fluticasone (FLONASE) 50 MCG/ACT nasal spray Place 1 spray into both nostrils 2 (two) times daily.     furosemide  (LASIX ) 20 MG tablet TAKE 1 TABLET BY MOUTH EVERY DAY 90 tablet 3   KLOR-CON  M10 10 MEQ tablet TAKE 1 TABLET BY MOUTH EVERY DAY 90 tablet 3   lansoprazole (PREVACID) 30 MG capsule Take 30 mg by mouth 2 (two) times daily.     OPSUMIT  10 MG tablet TAKE 1 TABLET BY MOUTH DAILY. DO NOT HANDLE IF PREGNANT. DO NOT SPLIT, CRUSH, OR CHEW. REVIEW MEDICATION GUIDE. 30 tablet 11   rosuvastatin  (CRESTOR ) 10 MG tablet TAKE 1 TABLET BY MOUTH EVERY DAY 90 tablet 3   spironolactone  (ALDACTONE ) 25 MG tablet TAKE 1 TABLET EVERY DAY 90 tablet 3   tadalafil , PAH, (ADCIRCA ) 20 MG tablet TAKE 1 TABLET BY MOUTH TWICE A DAY 60 tablet 6   vitamin E 180 MG (400 UNITS) capsule Take 400 Units by mouth daily.       No current facility-administered medications for this encounter.    PHYSICAL EXAM: Vitals:   10/08/23 1022  BP: (!) 130/58  Pulse: 68  SpO2: 100%  Weight: 66.2 kg (146 lb)     Wt Readings from Last 3 Encounters:  10/08/23 66.2 kg (146 lb)  08/30/23 65.9 kg (145 lb 3.2 oz)  04/10/23 66.5 kg (146 lb 9.6 oz)   General:   Well appearing. No resp difficulty on O2 HEENT: normal Neck: supple. no JVD. Carotids 2+ bilat; no bruits. No lymphadenopathy or thryomegaly appreciated. Cor: PMI nondisplaced. Regular rate & rhythm. No rubs, gallops or murmurs. Lungs: clear Abdomen: soft, nontender, nondistended. No hepatosplenomegaly. No bruits or  masses. Good bowel sounds. Extremities: no cyanosis, clubbing, rash, edema + arthritic changes and telenjectacias Neuro: alert & orientedx3, cranial nerves grossly intact. moves all 4 extremities w/o difficulty. 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 RVSP IVC small  - Echo 01/16/22: EF 60-65% RV function normal RVSP IVC small  - Echo 04/10/23 EF 65% Moderate LAE RV normal. No significant TR ot estimate RVSP IVC small Personally reviewed - Doing well. NYHA I-II. Very sedentary - Volume ok  - Continue macitentain 10 mg daily. - Continue tadalafil  40 mg daily. - Encouraged to be more active - 11/24 432m HR 77-120 sats 86-99 on 2L  - Reveal lite score 4 (low risk)   2. CREST syndrome  - stable - no swallowing issues, continue PPI. - Encouraged her to f/u with Dr Olivia Washington   3. HTN - Blood pressure well controlled. Continue current regimen.  4. Diastolic HF - stable NYHA I-II - Continue lasix  20 mg daily + 10 mEq KCl daily.  5. Chronic hypoxic respiratory failure - Followed by Dr. Baldwin Levee. - Continue home O2 - hi-RES CT 7/21: no ILD, fluid filled/dilated esophagus consistent with CREST, air-trapping, enlarged trunk indicative of pulm arterial htn - cough improved with addition of Pepcid   6. HL  - Continue statin - check lipids today  Jules Oar, MD  11:09 AM

## 2023-10-08 NOTE — Addendum Note (Signed)
 Encounter addended by: Glorietta Lark, RN on: 10/08/2023 11:33 AM  Actions taken: Order list changed, Diagnosis association updated, Clinical Note Signed, Charge Capture section accepted

## 2023-10-16 DIAGNOSIS — K219 Gastro-esophageal reflux disease without esophagitis: Secondary | ICD-10-CM | POA: Diagnosis not present

## 2023-10-31 DIAGNOSIS — I2783 Eisenmenger's syndrome: Secondary | ICD-10-CM | POA: Diagnosis not present

## 2023-11-05 ENCOUNTER — Encounter (HOSPITAL_COMMUNITY): Payer: Self-pay | Admitting: *Deleted

## 2023-11-06 ENCOUNTER — Encounter (HOSPITAL_COMMUNITY): Payer: Self-pay | Admitting: *Deleted

## 2023-11-14 ENCOUNTER — Encounter: Payer: Self-pay | Admitting: Emergency Medicine

## 2023-11-14 ENCOUNTER — Ambulatory Visit: Admitting: Emergency Medicine

## 2023-11-14 VITALS — BP 136/84 | HR 76 | Temp 97.6°F | Ht 65.0 in | Wt 145.2 lb

## 2023-11-14 DIAGNOSIS — I272 Pulmonary hypertension, unspecified: Secondary | ICD-10-CM

## 2023-11-14 DIAGNOSIS — R053 Chronic cough: Secondary | ICD-10-CM

## 2023-11-14 DIAGNOSIS — J9611 Chronic respiratory failure with hypoxia: Secondary | ICD-10-CM

## 2023-11-14 DIAGNOSIS — J4520 Mild intermittent asthma, uncomplicated: Secondary | ICD-10-CM | POA: Diagnosis not present

## 2023-11-14 MED ORDER — BENZONATATE 100 MG PO CAPS: 100.0000 mg | ORAL_CAPSULE | Freq: Four times a day (QID) | ORAL | 3 refills | Status: DC | PRN

## 2023-11-14 NOTE — Assessment & Plan Note (Signed)
 With components of rhinitis, particularly GERD.  She is better with the addition of Pepcid to her Prevacid.  Plan to continue this.  Continue to use Tessalon  as needed for cough suppression.  Will refill this today.  The upper airway irritation will impact our decision making regarding whether to start scheduled ICS/LABA.  I would like to avoid for now  Please continue your Prevacid twice a day Please continue your famotidine twice a day Continue your fluticasone nasal spray as you have been taking Continue either your Zyrtec or loratadine once daily (alternating)

## 2023-11-14 NOTE — Progress Notes (Signed)
 Subjective:    Patient ID: Olivia Washington, female    DOB: [birth year], 78 y.o.   MRN: [id number]  HPI 78 year old never smoker with a history of crest syndrome and associated PAH on tadalafil  and Opsumit , systemic hypertension, GERD (lap Nissen).  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  COVID in 2023. She has GERD Prevacid, allergic rhinitis on cetirizine and fluticasone.  She has resting and exertional hypoxemia on 2L/min. No SOB, wheeze or chest tightness.  She is here today for chronic cough, began to happen several years ago without any clear inciting event. She has some clear to whitish mucous, no hemoptysis. Seems to happen most in the am when she gets up, clear mucous at that time. Cough can also happen in evening. She has breakthrough GERD on the Prevacid due to her CREST.   Pulmonary function testing 11/17/2019 reviewed by me showed mild obstruction with an FEV1 of 89% predicted.  No bronchodilator response, normal lung volumes, decreased diffusion capacity that does not correct to the normal range when adjusted for alveolar volume.  RV 11/14/2023 --78 year old woman who is a never smoker with a history of crest syndrome with associated PAH and chronic hypoxemic respiratory failure on 2 L/min.  She is on Opsumit , tadalafil .  Also with systemic hypertension, GERD, allergic rhinitis.  I saw her in March for chronic cough.  She had mild obstruction noted on pulmonary function testing from 2021.  A large part of this seemed to be GERD which is difficult in the setting of crest syndrome.  She has a history of a Nissen.  We continued Prevacid twice daily and started Pepcid in addition.  Also continued cetirizine and fluticasone nasal sprays, guaifenesin DM as needed.  I gave her Tessalon  Perles to use for cough suppression as well as an albuterol  inhaler to try using as needed. Today she reports Her cough is much better. She does still have some globus sensation a few times a  week - the tessalon  helps. She thinks things are better w the addition of pepcid to prevacid. She has used albuterol  3x in setting coughing spell - has helped her. She hasn't really tried it for SOB.     Review of Systems As per HPI  Past Medical History:  Diagnosis Date   Allergy history unknown    CHF (congestive heart failure) (HCC)    CREST (calcinosis, Raynaud's phenomenon, esophageal dysfunction, sclerodactyly, telangiectasia) (HCC)    CREST syndrome (HCC)    GERD (gastroesophageal reflux disease)    Heart palpitations    Hyperlipidemia    Hypertension    Hypertension    Migraine    Osteoarthrosis    unspecified whether generalized or localized   Pulmonary hypertension (HCC)    SOB (shortness of breath)    Thyroid  nodule      Family History  Problem Relation Age of Onset   Stroke Mother    Hypertension Mother    Hyperlipidemia Mother    Diabetes Father    Heart failure Father    Hyperlipidemia Brother    Hypertension Brother    Cancer Father        52     Social History   Socioeconomic History   Marital status: Married    Spouse name: Not on file   Number of children: 2   Years of education: Not on file   Highest education level: Not on file  Occupational History   Occupation: Technical brewer:  [employer]  Tobacco Use   Smoking status: Never   Smokeless tobacco: Never  Substance and Sexual Activity   Alcohol use: No   Drug use: No   Sexual activity: Not Currently  Other Topics Concern   Not on file  Social History Narrative   Not on file   Social Drivers of Health   Financial Resource Strain: Low Risk  (10/13/2023)   Received from [facility]   Overall Financial Resource Strain (CARDIA)    Difficulty of Paying Living Expenses: Not hard at all  Food Insecurity: No Food Insecurity (10/13/2023)   Received from [facility]   Hunger Vital Sign    Worried About Running Out of Food in the Last Year: Never true    Ran Out  of Food in the Last Year: Never true  Transportation Needs: No Transportation Needs (10/13/2023)   Received from [facility] - Transportation    Lack of Transportation (Medical): No    Lack of Transportation (Non-Medical): No  Physical Activity: Insufficiently Active (10/13/2023)   Received from [facility]   Exercise Vital Sign    Days of Exercise per Week: 3 days    Minutes of Exercise per Session: 20 min  Stress: No Stress Concern Present (10/13/2023)   Received from [facility] - Occupational Stress Questionnaire    Feeling of Stress : Not at all  Social Connections: Socially Integrated (10/13/2023)   Received from [facility]   Social Network    How would you rate your social network (family, work, friends)?: Good participation with social networks  Intimate Partner Violence: Not At Risk (10/13/2023)   Received from [facility]   HITS    Over the last 12 months how often did your partner physically hurt you?: Never    Over the last 12 months how often did your partner insult you or talk down to you?: Never    Over the last 12 months how often did your partner threaten you with physical harm?: Never    Over the last 12 months how often did your partner scream or curse at you?: Never     Allergies  Allergen Reactions   Propofol Other (See Comments)    CANNOT HAVE DUE TO PULMONARY HYPERTENSION !!!   Erythromycin Other (See Comments)    Interacts with Adcirca    Other Other (See Comments)    RAW ONIONS, SOME CHEESES CAUSE MIGRAINES   Allegra [Fexofenadine]     vomitting   Atorvastatin Other (See Comments)    Muscle aches   Chlorzoxazone Other (See Comments)    Altered Mental status   Cortisporin [Neomycin-Polymyxin-Hc]     Blister    Macrodantin Swelling   Pravastatin Other (See Comments)    Muscle aches    Simvastatin Other (See Comments)    Muscle aches    Zithromax [Azithromycin Dihydrate]     migraines    Cephalexin Itching and Rash   Neomycin Rash   Neomycin-Polymyxin-Hc Rash   Penicillins Rash   Septra [Bactrim] Itching and Rash   Sulfa Drugs Cross Reactors Rash   Tape Rash     Outpatient Medications Prior to Visit  Medication Sig Dispense Refill   albuterol  (VENTOLIN  HFA) 108 (90 Base) MCG/ACT inhaler Inhale 2 puffs into the lungs every 6 (six) hours as needed for wheezing or shortness of breath. 8 g 6   aspirin  81 MG tablet Take 81 mg by mouth daily.  Calcium -Vitamin D-Vitamin K 500-100-40 MG-UNT-MCG CHEW Chew 2 tablets by mouth daily in the afternoon.     cetirizine (ZYRTEC) 10 MG tablet Take 10 mg by mouth daily.     Cranberry 1000 MG CAPS Take 1,000 mg by mouth daily.     diltiazem  (CARDIZEM  CD) 240 MG 24 hr capsule TAKE 1 CAPSULE BY MOUTH EVERY DAY 90 capsule 3   diphenhydrAMINE-Phenylephrine 6.25-2.5 MG/5ML LIQD Take 10 mLs by mouth daily as needed (Cough).     famotidine (PEPCID) 20 MG tablet Take 20 mg by mouth 2 (two) times daily.     fluticasone (FLONASE) 50 MCG/ACT nasal spray Place 1 spray into both nostrils 2 (two) times daily.     furosemide  (LASIX ) 20 MG tablet TAKE 1 TABLET BY MOUTH EVERY DAY 90 tablet 3   KLOR-CON  M10 10 MEQ tablet TAKE 1 TABLET BY MOUTH EVERY DAY 90 tablet 3   lansoprazole (PREVACID) 30 MG capsule Take 30 mg by mouth 2 (two) times daily.     OPSUMIT  10 MG tablet TAKE 1 TABLET BY MOUTH DAILY. DO NOT HANDLE IF PREGNANT. DO NOT SPLIT, CRUSH, OR CHEW. REVIEW MEDICATION GUIDE. 30 tablet 11   rosuvastatin  (CRESTOR ) 10 MG tablet TAKE 1 TABLET BY MOUTH EVERY DAY 90 tablet 3   spironolactone  (ALDACTONE ) 25 MG tablet TAKE 1 TABLET EVERY DAY 90 tablet 3   tadalafil , PAH, (ADCIRCA ) 20 MG tablet TAKE 1 TABLET BY MOUTH TWICE A DAY 60 tablet 6   vitamin E 180 MG (400 UNITS) capsule Take 400 Units by mouth daily.       benzonatate  (TESSALON ) 100 MG capsule Take 1 capsule (100 mg total) by mouth every 6 (six) hours as needed for cough. 30 capsule 1   No  facility-administered medications prior to visit.        Objective:   Physical Exam Vitals:   11/14/23 1133  BP: 136/84  Pulse: 76  Temp: 97.6 F (36.4 C)  TempSrc: Temporal  SpO2: 95%  Weight: 145 lb 3.2 oz (65.9 kg)  Height: 5' 5 (1.651 m)   Gen: Pleasant, thin, in no distress,  normal affect  ENT: No lesions,  mouth clear,  oropharynx clear, no postnasal drip  Neck: No JVD, no stridor but hoarse voice  Lungs: No use of accessory muscles, no crackles or wheezing on normal respiration, no wheeze on forced expiration  Cardiovascular: RRR, heart sounds normal, no murmur or gallops, no peripheral edema  Musculoskeletal: No deformities, no cyanosis or clubbing  Neuro: alert, awake, non focal  Skin: Warm, no lesions or rash      Assessment & Plan:  Mild intermittent asthma With mild obstruction on PFT 2021.  She did notice some symptomatic improvement with albuterol , suspect she does have some lower airways obstruction in addition to her upper airway irritation syndrome.  Discussed possibly repeating her PFT, starting scheduled BD therapy.  I think the most regional approach for starters would be to use her albuterol  judiciously, see how it helps, see how frequently she needs it  Use albuterol  2 puffs up to every 4 hours if you needed for shortness of breath or spells of coughing.  You could also try pretreating exertion with 2 puffs.  Try taking it about 10 to 15 minutes before you exert yourself. We will hold off on starting a scheduled inhaler medication for now. We will hold off on repeat pulmonary function testing for now. Follow-up in our office in 1 year, sooner if you have any  problems or flaring symptoms.  Chronic cough With components of rhinitis, particularly GERD.  She is better with the addition of Pepcid to her Prevacid.  Plan to continue this.  Continue to use Tessalon  as needed for cough suppression.  Will refill this today.  The upper airway irritation will  impact our decision making regarding whether to start scheduled ICS/LABA.  I would like to avoid for now  Please continue your Prevacid twice a day Please continue your famotidine twice a day Continue your fluticasone nasal spray as you have been taking Continue either your Zyrtec or loratadine once daily (alternating)  Pulmonary HTN (HCC) Continue follow-up with cardiology and her current medications.  Chronic hypoxemic respiratory failure (HCC) Principally due to her PAH.  Continue her current supplemental oxygen  as ordered.    Racheal Buddle, MD, PhD 11/14/2023, 12:57 PM [facility] [phone] or if no answer before 7:00PM call [phone] For any issues after 7:00PM please call eLink [phone]

## 2023-11-14 NOTE — Patient Instructions (Addendum)
 Please continue your Prevacid twice a day Please continue your famotidine twice a day Continue your fluticasone nasal spray as you have been taking Continue either your Zyrtec or loratadine once daily (alternating) Use albuterol  2 puffs up to every 4 hours if you needed for shortness of breath or spells of coughing.  You could also try pretreating exertion with 2 puffs.  Try taking it about 10 to 15 minutes before you exert yourself. We will hold off on starting a scheduled inhaler medication for now. We will hold off on repeat pulmonary function testing for now. Okay to use Tessalon  Perles up to every 6 hours if needed for cough suppression. Continue your oxygen  at all times as you have been using it. Follow with cardiology as planned At all times as you have been using it. Follow with cardiology as planned Follow-up in our office in 1 year, sooner if you have any problems or flaring symptoms.

## 2023-11-14 NOTE — Assessment & Plan Note (Signed)
 Principally due to her PAH.  Continue her current supplemental oxygen  as ordered.

## 2023-11-14 NOTE — Telephone Encounter (Signed)
 Pt let office know that she was excused from jury duty due to her age, nothing further needed from our office at this time

## 2023-11-14 NOTE — Assessment & Plan Note (Signed)
 With mild obstruction on PFT 2021.  She did notice some symptomatic improvement with albuterol , suspect she does have some lower airways obstruction in addition to her upper airway irritation syndrome.  Discussed possibly repeating her PFT, starting scheduled BD therapy.  I think the most regional approach for starters would be to use her albuterol  judiciously, see how it helps, see how frequently she needs it  Use albuterol  2 puffs up to every 4 hours if you needed for shortness of breath or spells of coughing.  You could also try pretreating exertion with 2 puffs.  Try taking it about 10 to 15 minutes before you exert yourself. We will hold off on starting a scheduled inhaler medication for now. We will hold off on repeat pulmonary function testing for now. Follow-up in our office in 1 year, sooner if you have any problems or flaring symptoms.

## 2023-11-14 NOTE — Assessment & Plan Note (Signed)
 Continue follow-up with cardiology and her current medications.

## 2023-11-21 ENCOUNTER — Other Ambulatory Visit (HOSPITAL_COMMUNITY): Payer: Self-pay | Admitting: Internal Medicine

## 2023-11-21 ENCOUNTER — Other Ambulatory Visit: Payer: Self-pay

## 2023-11-22 ENCOUNTER — Other Ambulatory Visit: Payer: Self-pay

## 2023-11-22 MED ORDER — FUROSEMIDE 20 MG PO TABS: 20.0000 mg | ORAL_TABLET | Freq: Every day | ORAL | 3 refills | Status: AC

## 2023-11-22 MED ORDER — POTASSIUM CHLORIDE CRYS ER 10 MEQ PO TBCR: 10.0000 meq | EXTENDED_RELEASE_TABLET | Freq: Every day | ORAL | 3 refills | Status: DC

## 2023-11-22 MED ORDER — ROSUVASTATIN CALCIUM 10 MG PO TABS: 10.0000 mg | ORAL_TABLET | Freq: Every day | ORAL | 3 refills | Status: AC

## 2023-11-22 NOTE — Telephone Encounter (Signed)
 Opened to fill paper script to find it had been filled

## 2023-11-22 NOTE — Addendum Note (Signed)
 Addended by: Rosalita Combe on: 11/22/2023 04:38 PM   Modules accepted: Orders

## 2023-11-23 ENCOUNTER — Other Ambulatory Visit: Payer: Self-pay | Admitting: *Deleted

## 2023-11-23 MED ORDER — DILTIAZEM HCL ER COATED BEADS 240 MG PO CP24: 240.0000 mg | ORAL_CAPSULE | Freq: Every day | ORAL | 3 refills | Status: AC

## 2023-12-11 ENCOUNTER — Other Ambulatory Visit (HOSPITAL_COMMUNITY): Payer: Self-pay

## 2023-12-11 DIAGNOSIS — I5032 Chronic diastolic (congestive) heart failure: Secondary | ICD-10-CM

## 2023-12-11 MED ORDER — POTASSIUM CHLORIDE CRYS ER 10 MEQ PO TBCR: 10.0000 meq | EXTENDED_RELEASE_TABLET | Freq: Every day | ORAL | 3 refills | Status: DC

## 2024-01-21 ENCOUNTER — Encounter: Payer: Self-pay | Admitting: Emergency Medicine

## 2024-01-23 MED ORDER — BENZONATATE 100 MG PO CAPS: 100.0000 mg | ORAL_CAPSULE | Freq: Four times a day (QID) | ORAL | 3 refills | Status: AC | PRN

## 2024-02-11 ENCOUNTER — Other Ambulatory Visit (HOSPITAL_COMMUNITY): Payer: Self-pay | Admitting: Internal Medicine

## 2024-02-18 ENCOUNTER — Other Ambulatory Visit (HOSPITAL_COMMUNITY): Payer: Self-pay | Admitting: Internal Medicine

## 2024-02-20 ENCOUNTER — Other Ambulatory Visit (HOSPITAL_COMMUNITY): Payer: Self-pay | Admitting: Internal Medicine

## 2024-03-10 ENCOUNTER — Other Ambulatory Visit: Payer: Self-pay

## 2024-03-10 DIAGNOSIS — I5032 Chronic diastolic (congestive) heart failure: Secondary | ICD-10-CM

## 2024-03-12 MED ORDER — POTASSIUM CHLORIDE CRYS ER 10 MEQ PO TBCR: 10.0000 meq | EXTENDED_RELEASE_TABLET | Freq: Every day | ORAL | 3 refills | Status: AC

## 2024-05-09 ENCOUNTER — Inpatient Hospital Stay (HOSPITAL_COMMUNITY): Admission: RE | Admit: 2024-05-09 | Discharge: 2024-05-09 | Attending: Internal Medicine | Admitting: Internal Medicine

## 2024-05-09 ENCOUNTER — Ambulatory Visit (HOSPITAL_COMMUNITY): Admission: RE | Admit: 2024-05-09 | Discharge: 2024-05-09 | Attending: *Deleted | Admitting: *Deleted

## 2024-05-09 ENCOUNTER — Encounter (HOSPITAL_COMMUNITY): Payer: Self-pay | Admitting: Internal Medicine

## 2024-05-09 VITALS — BP 162/62 | HR 83 | Ht 65.0 in | Wt 136.6 lb

## 2024-05-09 DIAGNOSIS — R634 Abnormal weight loss: Secondary | ICD-10-CM

## 2024-05-09 DIAGNOSIS — E782 Mixed hyperlipidemia: Secondary | ICD-10-CM

## 2024-05-09 DIAGNOSIS — I272 Pulmonary hypertension, unspecified: Secondary | ICD-10-CM

## 2024-05-09 DIAGNOSIS — I10 Essential (primary) hypertension: Secondary | ICD-10-CM

## 2024-05-09 DIAGNOSIS — M341 CR(E)ST syndrome: Secondary | ICD-10-CM

## 2024-05-09 DIAGNOSIS — I5032 Chronic diastolic (congestive) heart failure: Secondary | ICD-10-CM

## 2024-05-09 LAB — COMPREHENSIVE METABOLIC PANEL WITH GFR
ALT: 7 U/L (ref 0–44)
AST: 20 U/L (ref 15–41)
Albumin: 3.7 g/dL (ref 3.5–5.0)
Alkaline Phosphatase: 72 U/L (ref 38–126)
Anion gap: 14 (ref 5–15)
BUN: 14 mg/dL (ref 8–23)
CO2: 22 mmol/L (ref 22–32)
Calcium: 10.2 mg/dL (ref 8.9–10.3)
Chloride: 102 mmol/L (ref 98–111)
Creatinine, Ser: 1.07 mg/dL — ABNORMAL HIGH (ref 0.44–1.00)
GFR, Estimated: 54 mL/min — ABNORMAL LOW (ref >60)
Glucose, Bld: 103 mg/dL — ABNORMAL HIGH (ref 70–99)
Potassium: 3.5 mmol/L (ref 3.5–5.1)
Sodium: 138 mmol/L (ref 135–145)
Total Bilirubin: 0.3 mg/dL (ref 0.0–1.2)
Total Protein: 6.5 g/dL (ref 6.5–8.1)

## 2024-05-09 LAB — TSH: TSH: 1.718 u[IU]/mL (ref 0.350–4.500)

## 2024-05-09 LAB — CBC
HCT: 36.6 % (ref 36.0–46.0)
Hemoglobin: 11.2 g/dL — ABNORMAL LOW (ref 12.0–15.0)
MCH: 28.6 pg (ref 26.0–34.0)
MCHC: 30.6 g/dL (ref 30.0–36.0)
MCV: 93.4 fL (ref 80.0–100.0)
Platelets: 352 K/uL (ref 150–400)
RBC: 3.92 MIL/uL (ref 3.87–5.11)
RDW: 18.6 % — ABNORMAL HIGH (ref 11.5–15.5)
WBC: 12.4 K/uL — ABNORMAL HIGH (ref 4.0–10.5)
nRBC: 0 % (ref 0.0–0.2)

## 2024-05-09 LAB — ECHOCARDIOGRAM COMPLETE
AR max vel: 1.61 cm2
AV Area VTI: 1.63 cm2
AV Area mean vel: 1.71 cm2
AV Mean grad: 7 mmHg
AV Peak grad: 13.1 mmHg
Ao pk vel: 1.81 m/s
Area-P 1/2: 2.9 cm2
MV VTI: 2.02 cm2
S' Lateral: 2.6 cm

## 2024-05-09 NOTE — Patient Instructions (Addendum)
 There has been no changes to your medications.  Labs done today, your results will be available in MyChart, we will contact you for abnormal readings.  Your physician recommends that you schedule a follow-up appointment in: 6 months ( June 2026) ** PLEASE CALL THE OFFICE IN APRIL 2026 TO ARRANGE YOUR FOLLOW UP APPOINTMENT.**  If you have any questions or concerns before your next appointment please send us  a message through [place] or call our office at [phone].    TO LEAVE A MESSAGE FOR THE NURSE SELECT OPTION 2, PLEASE LEAVE A MESSAGE INCLUDING: YOUR NAME DATE OF BIRTH CALL BACK NUMBER REASON FOR CALL**this is important as we prioritize the call backs  YOU WILL RECEIVE A CALL BACK THE SAME DAY AS LONG AS YOU CALL BEFORE 4:00 PM  At the [facility], you and your health needs are our priority. As part of our continuing mission to provide you with exceptional heart care, we have created designated Provider Care Teams. These Care Teams include your primary Cardiologist (physician) and Advanced Practice Providers (APPs- Physician Assistants and Nurse Practitioners) who all work together to provide you with the care you need, when you need it.   You may see any of the following providers on your designated Care Team at your next follow up: Dr Toribio Fuel Dr Ezra Shuck Dr. Morene Brownie Greig Mosses, NP Caffie Shed, [place] [facility] [place], [place] Beckey Coe, NP Jordan Lee, NP Ellouise Class, NP Tinnie Redman, PharmD Jaun Bash, PharmD   Please be sure to bring in all your medications bottles to every appointment.    Thank you for choosing [facility]

## 2024-05-09 NOTE — Progress Notes (Signed)
 6 Min Walk Test Completed  Pt ambulated 1,400 ft (426.72 m) O2 Sat ranged 92-98 on 2 L oxygen  HR ranged 119-128 min

## 2024-05-09 NOTE — Progress Notes (Signed)
 Patient ID: Olivia Washington, female   DOB: Jan 16, 1946, 78 y.o.   MRN: [id number]  Advanced Heart Failure Clinic Note  Primary Care: Dr. Adaline Rheumatologist: Dr. Mai ENT: Dr. Mable  HF Cardiologist: Dr. 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 Non-smoker.  In 1/18 DLCO was down so we did hi-res CT. No ILD. Mosaic pattern c/w pHTN.  On macitentan  10 and tadalafil  40.   Echo 04/10/23 EF 65% Moderate LAE RV normal. No significant TR ot estimate RVSP IVC small  RHC 8/23 RA 4 PA 32/7 (19) PCW 7 Fick 7.2/4.1 PVR 1.5   Today she returns for [facility] follow up. Feels good but has lost 10 pounds without trying. Says she can't taste food. Denies SOB, edema, orthopnea or PND. No night sweats or other B symptoms. Compliant with meds. Saw Dr. Byrum and added albuterol  and tessalon  perles for cough.   Echo today 05/09/24  EF 65-70% RV normal RVSP 23 + aortic sclerosis   Cardiac studies:  Echo 01/16/22: EF 60-65% RV function normal RVSP IVC small   Echo  11/25/20: EF 60-65% RV normal. RVSP IVC small  Echo 6/21 EF 65% RV normal. No evidence PAH. IVC small Echo 9/19 EF 60% RV normal Trivial TR RVSP ~9mmHG. IVC small.  Echo 4/18 EF 60% RV normal IVC normal RVSP Echo 12/29/15 EF 55-60% RV normal. Trivial TR RVSP 35-40 IVC normal    RHC 11/18 RA = 2 RV = 31/3 PA = 35/10 (22) PCW = 8 Fick cardiac output/index = 9.6/5.2 PVR = 1.5 Ao sat = 100% PA sat = 80%, 80% High SVC = 82%  PFTs 11/17/19 FVC 3.14 (101%) FEV 2.08 (89%) FEV1/FVC 66% DLCO 48%   PFTs 05/25/16  FVC 3.17 (99%) FEV 2.30 (95%) FEV1/FVC 72 TLCO 4.01 (77%) DLCO 9.65 (37%)  PH Work up:  TTE 05/13/12: LVEF 55-60%. Grade 1 diastolic dysfunction. Ventricular septum with diastolic flattening. LA mildly dilated. RV mildly  dilated with systolic function mod reduced. RA mildly dilated. Atrial septum bowed right to left. PAPP 54 mmHg.  CT scan chest 12/3/130 showed no ILD, no acute or chronic PE but some dilation of the PA's. Left thyroid  enlargement with rightward tracheal deviation (ENT eval pending)  VQ scan 05/27/12: normal, no PE   RHC 05/24/12  RA: 11/8/6  RV: 86/7  PA: 78/27 (47) PCWP: 8/8/5   Cardiac Output  Thermodilution: 3.6 with index of 1.9  Fick : 5.9 with index of 3.1  11.6 woods units  At final adenosine  dose  76/28 (46)  CO (thermo) = 4.3 with index of 2.3  9.5 Woods units  LHC: normal cors    06/22/12 1150 ft, O2 sat did drop to 82% on RA and pt was placed on 2L O2 via [place] and O2 sat improved to 92%.  11/14: 6 min walk test completed, pt ambulated 1110 ft (338 m), O2 sat ranged from 89-99 % on 2L, HR ranged from 84-113 1160 ft on O2 6L 09/23/13 1000 ft sats 88% 2L. (different person walking with her)  7/15  1350 feet 87-100%  4/16 1200 feet, 366 m 8/16 1340 feet, 408 m  7/17  1480 feet 465m on 2L O2 sat nadir at 88%  09/27/16 1240 ft (378 m) on 2L  O2 sats ranged 85-96%, HR ranged 98-123. 11/22/16 1220 ft (360m) 10/08/17 1290 ft (345m) 05/14/20: on 2L O2 305 meters, O2 on 2L~90-92%, HR 91-137 bpm 11/25/20: on 2L 396.2 meters O2 Sat ranged 90%-98% 8/23 460m  11/24 462m HR 77-120 sats 86-99 on 2L   ROS: All systems negative except as listed in HPI, PMH and Problem List.  Past Medical History:  Diagnosis Date   Allergy history unknown    CHF (congestive heart failure) (HCC)    CREST (calcinosis, Raynaud's phenomenon, esophageal dysfunction, sclerodactyly, telangiectasia) (HCC)    CREST syndrome (HCC)    GERD (gastroesophageal reflux disease)    Heart palpitations    Hyperlipidemia    Hypertension    Hypertension    Migraine    Osteoarthrosis    unspecified whether generalized or localized   Pulmonary hypertension (HCC)     SOB (shortness of breath)    Thyroid  nodule     Current Outpatient Medications  Medication Sig Dispense Refill   albuterol  (VENTOLIN  HFA) 108 (90 Base) MCG/ACT inhaler Inhale 2 puffs into the lungs every 6 (six) hours as needed for wheezing or shortness of breath. 8 g 6   aspirin  81 MG tablet Take 81 mg by mouth daily.     benzonatate  (TESSALON ) 100 MG capsule Take 1 capsule (100 mg total) by mouth every 6 (six) hours as needed for cough. 30 capsule 3   Calcium -Vitamin D-Vitamin K 500-100-40 MG-UNT-MCG CHEW Chew 2 tablets by mouth daily in the afternoon.     cetirizine (ZYRTEC) 10 MG tablet Take 10 mg by mouth daily.     Cranberry 1000 MG CAPS Take 1,000 mg by mouth daily.     diltiazem  (CARDIZEM  CD) 240 MG 24 hr capsule Take 1 capsule (240 mg total) by mouth daily. 90 capsule 3   diphenhydrAMINE-Phenylephrine 6.25-2.5 MG/5ML LIQD Take 10 mLs by mouth daily as needed (Cough).     famotidine (PEPCID) 20 MG tablet Take 20 mg by mouth 2 (two) times daily.     fluticasone (FLONASE) 50 MCG/ACT nasal spray Place 1 spray into both nostrils 2 (two) times daily.     furosemide  (LASIX ) 20 MG tablet Take 1 tablet (20 mg total) by mouth daily. 90 tablet 3   lansoprazole (PREVACID) 30 MG capsule Take 30 mg by mouth 2 (two) times daily.     OPSUMIT  10 MG tablet TAKE 1 TABLET BY MOUTH DAILY. DO NOT HANDLE IF PREGNANT. DO NOT SPLIT, CRUSH, OR CHEW. REVIEW MEDICATION GUIDE 30 tablet 11   potassium chloride  (KLOR-CON  M10) 10 MEQ tablet Take 1 tablet (10 mEq total) by mouth daily. 90 tablet 3   rosuvastatin  (CRESTOR ) 10 MG tablet Take 1 tablet (10 mg total) by mouth daily. 90 tablet 3   spironolactone  (ALDACTONE ) 25 MG tablet TAKE 1 TABLET EVERY DAY 90 tablet 3   tadalafil , PAH, (ADCIRCA ) 20 MG tablet Take 2 tablets (40 mg total) by mouth daily. 180 tablet 3   vitamin E 180 MG (400 UNITS) capsule Take 400 Units by mouth daily.       No current facility-administered medications for this encounter.     PHYSICAL EXAM: Vitals:   05/09/24 1421  BP: (!) 162/62  Pulse: 83  SpO2: 100%  Weight: 62 kg (136 lb 9.6 oz)  Height: 5' 5 (1.651 m)     Wt Readings from Last 3 Encounters:  05/09/24 62 kg (136 lb 9.6 oz)  11/14/23 65.9 kg (145 lb  3.2 oz)  10/08/23 66.2 kg (146 lb)   General:  Sitting up on O2 No resp difficulty HEENT: normal Neck: supple. no JVD.  Cor: Regular rate & rhythm. No rubs, gallops or murmurs. Lungs: clear Abdomen: soft, nontender, nondistended.Good bowel sounds. Extremities: no cyanosis, clubbing, rash, edema Neuro: alert & orientedx3, cranial nerves grossly intact. moves all 4 extremities w/o difficulty. 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 RVSP IVC small  - Echo 01/16/22: EF 60-65% RV function normal RVSP IVC small  - Echo 04/10/23 EF 65% Moderate LAE RV normal. No significant TR ot estimate RVSP IVC small  - Echo today 05/09/24  EF 65-70% RV normal RVSP 23 + aortic sclerosis  - Stable NYHA I-II Very sedentary - Volume ok - Continue macitentain 10 mg daily. - Continue tadalafil  40 mg daily. - 11/24 445m HR 77-120 sats 86-99 on 2L  - today 05/09/24: 1,400 ft (426.72 m) O2 Sat 92-98% on 2 L oxygen  HR  119-128 min - Reveal lite score 4 (low risk)  2. CREST syndrome  - no swallowing issues, continue PPI. - stable  3. HTN - Blood pressure elevated here but has been ok at home  4. Diastolic HF - Volume ok - Continue lasix  20 mg daily + 10 mEq KCl daily.  5. Chronic hypoxic respiratory failure - Follows with Dr. Shelah  - Continue home O2 - hi-RES CT 7/21: no ILD, fluid filled/dilated esophagus consistent with CREST, air-trapping, enlarged trunk indicative of pulm arterial htn - cough improved with addition of Pepcid   6. HL  - Continue statin  7. Unintentional weight loss -  attributes to poor po intake due to lack of taste - check labs - no B symptoms - encouraged Boost supplements  Toribio Fuel, MD  2:56 PM
